# Patient Record
Sex: Female | Born: 1995 | Race: White | Hispanic: No | Marital: Single | Smoking: Never smoker
Health system: Southern US, Community
[De-identification: ages and names within clinical notes are randomized; demographics above are authoritative.]

## PROBLEM LIST (undated history)

## (undated) ENCOUNTER — Inpatient Hospital Stay (HOSPITAL_COMMUNITY): Payer: Self-pay

## (undated) DIAGNOSIS — K219 Gastro-esophageal reflux disease without esophagitis: Secondary | ICD-10-CM

## (undated) DIAGNOSIS — I959 Hypotension, unspecified: Secondary | ICD-10-CM

## (undated) DIAGNOSIS — A049 Bacterial intestinal infection, unspecified: Secondary | ICD-10-CM

## (undated) DIAGNOSIS — L03114 Cellulitis of left upper limb: Secondary | ICD-10-CM

## (undated) HISTORY — PX: OTHER SURGICAL HISTORY: SHX169

---

## 2014-05-18 ENCOUNTER — Ambulatory Visit (INDEPENDENT_AMBULATORY_CARE_PROVIDER_SITE_OTHER): Payer: BC Managed Care – PPO | Admitting: Family Medicine

## 2014-05-18 ENCOUNTER — Ambulatory Visit (INDEPENDENT_AMBULATORY_CARE_PROVIDER_SITE_OTHER): Payer: BC Managed Care – PPO

## 2014-05-18 VITALS — BP 108/72 | HR 90 | Temp 98.0°F | Resp 17 | Ht 67.0 in | Wt 128.0 lb

## 2014-05-18 DIAGNOSIS — M25522 Pain in left elbow: Secondary | ICD-10-CM

## 2014-05-18 DIAGNOSIS — M25529 Pain in unspecified elbow: Secondary | ICD-10-CM

## 2014-05-18 DIAGNOSIS — IMO0002 Reserved for concepts with insufficient information to code with codable children: Secondary | ICD-10-CM

## 2014-05-18 DIAGNOSIS — M25422 Effusion, left elbow: Secondary | ICD-10-CM

## 2014-05-18 DIAGNOSIS — M25429 Effusion, unspecified elbow: Secondary | ICD-10-CM

## 2014-05-18 LAB — POCT CBC
Granulocyte percent: 73.2 %G (ref 37–80)
HCT, POC: 43.5 % (ref 37.7–47.9)
Hemoglobin: 14.4 g/dL (ref 12.2–16.2)
Lymph, poc: 3.1 (ref 0.6–3.4)
MCH, POC: 28.6 pg (ref 27–31.2)
MCHC: 33.1 g/dL (ref 31.8–35.4)
MCV: 86.4 fL (ref 80–97)
MID (cbc): 0.3 (ref 0–0.9)
MPV: 9.3 fL (ref 0–99.8)
POC Granulocyte: 9.1 — AB (ref 2–6.9)
POC LYMPH PERCENT: 24.7 %L (ref 10–50)
POC MID %: 2.1 %M (ref 0–12)
Platelet Count, POC: 181 10*3/uL (ref 142–424)
RBC: 5.04 M/uL (ref 4.04–5.48)
RDW, POC: 12.7 %
WBC: 12.4 10*3/uL — AB (ref 4.6–10.2)

## 2014-05-18 LAB — POCT SEDIMENTATION RATE: POCT SED RATE: 10 mm/hr (ref 0–22)

## 2014-05-18 MED ORDER — DOXYCYCLINE HYCLATE 100 MG PO CAPS: 100.0000 mg | ORAL_CAPSULE | Freq: Two times a day (BID) | ORAL | Status: DC

## 2014-05-18 MED ORDER — HYDROCODONE-ACETAMINOPHEN 5-325 MG PO TABS: 1.0000 | ORAL_TABLET | Freq: Four times a day (QID) | ORAL | Status: DC | PRN

## 2014-05-18 NOTE — Progress Notes (Signed)
Subjective:    Patient ID: Megan Larsen, female    DOB: 10-07-1995, 18 y.o.   MRN: [id number] Chief Complaint  Patient presents with  . Insect Bite    arm 3 days ago     HPI  . History reviewed. No pertinent past medical history. No current outpatient prescriptions on file prior to visit.   No current facility-administered medications on file prior to visit.   No Known Allergies   Review of Systems  Constitutional: Positive for activity change. Negative for fever, chills and diaphoresis.  Musculoskeletal: Positive for arthralgias, joint swelling and myalgias. Negative for gait problem.  Skin: Positive for color change and rash. Negative for pallor and wound.  Hematological: Negative for adenopathy. Does not bruise/bleed easily.  Psychiatric/Behavioral: Positive for sleep disturbance. The patient is nervous/anxious.        Objective:  BP 108/72  Pulse 90  Temp(Src) 98 F (36.7 C) (Oral)  Resp 17  Ht 5\' 7"  (1.702 m)  Wt 128 lb (58.06 kg)  BMI 20.04 kg/m2  SpO2 98%  LMP 05/11/2014  Physical Exam  Constitutional: She is oriented to person, place, and time. She appears well-developed and well-nourished. No distress.  HENT:  Head: Normocephalic and atraumatic.  Right Ear: External ear normal.  Eyes: Conjunctivae are normal. No scleral icterus.  Pulmonary/Chest: Effort normal.  Musculoskeletal:       Left elbow: She exhibits decreased range of motion and swelling. She exhibits no effusion. Tenderness found. Lateral epicondyle tenderness noted. No olecranon process tenderness noted.  1 cm of induration, very tender, mildly warm, erythema  Neurological: She is alert and oriented to person, place, and time.  Skin: Skin is warm and dry. She is not diaphoretic. No erythema.  Psychiatric: She has a normal mood and affect. Her behavior is normal.     Results for orders placed in visit on 05/18/14  POCT CBC      Result Value Ref Range   WBC 12.4 (*) 4.6 - 10.2 K/uL   Lymph, poc 3.1  0.6 - 3.4   POC LYMPH PERCENT 24.7  10 - 50 %L   MID (cbc) 0.3  0 - 0.9   POC MID % 2.1  0 - 12 %M   POC Granulocyte 9.1 (*) 2 - 6.9   Granulocyte percent 73.2  37 - 80 %G   RBC 5.04  4.04 - 5.48 M/uL   Hemoglobin 14.4  12.2 - 16.2 g/dL   HCT, POC 40.9  81.1 - 47.9 %   MCV 86.4  80 - 97 fL   MCH, POC 28.6  27 - 31.2 pg   MCHC 33.1  31.8 - 35.4 g/dL   RDW, POC 91.4     Platelet Count, POC 181  142 - 424 K/uL   MPV 9.3  0 - 99.8 fL   Lab Results  Component Value Date   ESRSEDRATE 1 05/19/2014   POCTSEDRATE 10 05/18/2014    [facility] reading (PRIMARY) by  Dr. Clelia Croft. Left elbow: Soft tissue swelling, no bony abnormality. Assessment & Plan:   Pain and swelling of left elbow - Plan: DG Elbow Complete Left  Cellulitis and abscess of upper arm and forearm - Plan: POCT CBC, POCT SEDIMENTATION RATE - to early to I&D today - recheck w/ Weber at [facility].  Meds ordered this encounter  Medications  . doxycycline (VIBRAMYCIN) 100 MG capsule    Sig: Take 1 capsule (100 mg total) by mouth 2 (two) times  daily.    Dispense:  20 capsule    Refill:  0  . HYDROcodone-acetaminophen (NORCO/VICODIN) 5-325 MG per tablet    Sig: Take 1 tablet by mouth every 6 (six) hours as needed for moderate pain.    Dispense:  10 tablet    Refill:  0    Norberto SorensonEva Candee Hoon, MD MPH

## 2014-05-18 NOTE — Patient Instructions (Signed)
Abscess °An abscess is an infected area that contains a collection of pus and debris. It can occur in almost any part of the body. An abscess is also known as a furuncle or boil. °CAUSES  °An abscess occurs when tissue gets infected. This can occur from blockage of oil or sweat glands, infection of hair follicles, or a minor injury to the skin. As the body tries to fight the infection, pus collects in the area and creates pressure under the skin. This pressure causes pain. People with weakened immune systems have difficulty fighting infections and get certain abscesses more often.  °SYMPTOMS °Usually an abscess develops on the skin and becomes a painful mass that is red, warm, and tender. If the abscess forms under the skin, you may feel a moveable soft area under the skin. Some abscesses break open (rupture) on their own, but most will continue to get worse without care. The infection can spread deeper into the body and eventually into the bloodstream, causing you to feel ill.  °DIAGNOSIS  °Your caregiver will take your medical history and perform a physical exam. A sample of fluid may also be taken from the abscess to determine what is causing your infection. °TREATMENT  °Your caregiver may prescribe antibiotic medicines to fight the infection. However, taking antibiotics alone usually does not cure an abscess. Your caregiver may need to make a small cut (incision) in the abscess to drain the pus. In some cases, gauze is packed into the abscess to reduce pain and to continue draining the area. °HOME CARE INSTRUCTIONS  °· Only take over-the-counter or prescription medicines for pain, discomfort, or fever as directed by your caregiver. °· If you were prescribed antibiotics, take them as directed. Finish them even if you start to feel better. °· If gauze is used, follow your caregiver's directions for changing the gauze. °· To avoid spreading the infection: °¨ Keep your draining abscess covered with a  bandage. °¨ Wash your hands well. °¨ Do not share personal care items, towels, or whirlpools with others. °¨ Avoid skin contact with others. °· Keep your skin and clothes clean around the abscess. °· Keep all follow-up appointments as directed by your caregiver. °SEEK MEDICAL CARE IF:  °· You have increased pain, swelling, redness, fluid drainage, or bleeding. °· You have muscle aches, chills, or a general ill feeling. °· You have a fever. °MAKE SURE YOU:  °· Understand these instructions. °· Will watch your condition. °· Will get help right away if you are not doing well or get worse. °Document Released: 06/27/2005 Document Revised: 03/18/2012 Document Reviewed: 11/30/2011 °ExitCare® Patient Information ©2015 [employer]. This information is not intended to replace advice given to you by your health care provider. Make sure you discuss any questions you have with your health care provider. ° °Spider Bite °Spider bites are not common. Most spider bites do not cause serious problems. The elderly, very young children, and people with certain existing medical conditions are more likely to experience significant symptoms. °SYMPTOMS  °Spider bites may not cause any pain at first. Within 1 or 2 days of the bite, there may be swelling, redness, and pain in the bite area. However, some spider bites can cause pain within the first hour. °TREATMENT  °Your caregiver may prescribe antibiotic medicine if a bacterial infection develops in the bite. However, not all spider bites require antibiotics or prescription medicines.  °HOME CARE INSTRUCTIONS °· Do not scratch the bite area. °· Keep the bite area clean and   dry. Wash the area with soap and water as directed. °· Put ice or cool compresses on the bite area. °¨ Put ice in a plastic bag. °¨ Place a towel between your skin and the bag. °¨ Leave the ice on for 20 minutes, 4 times a day for the first 2 to 3 days, or as directed. °· Keep the bite area elevated above the level of  your heart. This helps reduce redness and swelling. °· Only take over-the-counter or prescription medicines as directed by your caregiver. °· If you are given antibiotics, take them as directed. Finish them even if you start to feel better. °You may need a tetanus shot if: °· You cannot remember when you had your last tetanus shot. °· You have never had a tetanus shot. °· The injury broke your skin. °If you get a tetanus shot, your arm may swell, get red, and feel warm to the touch. This is common and not a problem. If you need a tetanus shot and you choose not to have one, there is a rare chance of getting tetanus. Sickness from tetanus can be serious. °SEEK MEDICAL CARE IF: °Your bite is not better after 3 days of treatment. °SEEK IMMEDIATE MEDICAL CARE IF: °· Your bite turns purple or develops increased swelling, pain, or redness. °· You develop shortness of breath or chest pain. °· You have muscle cramps or painful muscle spasms. °· You develop abdominal pain, nausea, or vomiting. °· You feel unusually tired or sleepy. °MAKE SURE YOU: °· Understand these instructions. °· Will watch your condition. °· Will get help right away if you are not doing well or get worse. °Document Released: 10/25/2004 Document Revised: 12/10/2011 Document Reviewed: 04/18/2011 °ExitCare® Patient Information ©2015 [employer]. This information is not intended to replace advice given to you by your health care provider. Make sure you discuss any questions you have with your health care provider. ° °

## 2014-05-19 ENCOUNTER — Inpatient Hospital Stay (HOSPITAL_COMMUNITY)
Admission: EM | Admit: 2014-05-19 | Discharge: 2014-05-23 | DRG: 603 | Disposition: A | Discharge status: Home / Self Care | Payer: BC Managed Care – PPO | Attending: Internal Medicine | Admitting: Internal Medicine

## 2014-05-19 ENCOUNTER — Encounter (HOSPITAL_COMMUNITY): Payer: Self-pay | Admitting: Emergency Medicine

## 2014-05-19 DIAGNOSIS — L03114 Cellulitis of left upper limb: Secondary | ICD-10-CM

## 2014-05-19 DIAGNOSIS — IMO0002 Reserved for concepts with insufficient information to code with codable children: Principal | ICD-10-CM | POA: Diagnosis present

## 2014-05-19 DIAGNOSIS — E876 Hypokalemia: Secondary | ICD-10-CM | POA: Diagnosis present

## 2014-05-19 DIAGNOSIS — L0390 Cellulitis, unspecified: Secondary | ICD-10-CM | POA: Diagnosis present

## 2014-05-19 LAB — BASIC METABOLIC PANEL
Anion gap: 15 (ref 5–15)
BUN: 19 mg/dL (ref 6–23)
CO2: 23 mEq/L (ref 19–32)
Calcium: 9.3 mg/dL (ref 8.4–10.5)
Chloride: 100 mEq/L (ref 96–112)
Creatinine, Ser: 0.95 mg/dL (ref 0.50–1.10)
GFR calc Af Amer: >90 mL/min (ref >90)
GFR calc non Af Amer: 87 mL/min — ABNORMAL LOW (ref >90)
Glucose, Bld: 79 mg/dL (ref 70–99)
Potassium: 3.4 mEq/L — ABNORMAL LOW (ref 3.7–5.3)
Sodium: 138 mEq/L (ref 137–147)

## 2014-05-19 LAB — CBC WITH DIFFERENTIAL/PLATELET
Basophils Absolute: 0 10*3/uL (ref 0.0–0.1)
Basophils Relative: 0 % (ref 0–1)
Eosinophils Absolute: 0.1 10*3/uL (ref 0.0–0.7)
Eosinophils Relative: 1 % (ref 0–5)
HCT: 40.2 % (ref 36.0–46.0)
Hemoglobin: 14 g/dL (ref 12.0–15.0)
Lymphocytes Relative: 27 % (ref 12–46)
Lymphs Abs: 3.7 10*3/uL (ref 0.7–4.0)
MCH: 29.4 pg (ref 26.0–34.0)
MCHC: 34.8 g/dL (ref 30.0–36.0)
MCV: 84.5 fL (ref 78.0–100.0)
Monocytes Absolute: 1.1 10*3/uL — ABNORMAL HIGH (ref 0.1–1.0)
Monocytes Relative: 8 % (ref 3–12)
Neutro Abs: 8.7 10*3/uL — ABNORMAL HIGH (ref 1.7–7.7)
Neutrophils Relative %: 64 % (ref 43–77)
Platelets: 177 10*3/uL (ref 150–400)
RBC: 4.76 MIL/uL (ref 3.87–5.11)
RDW: 12.3 % (ref 11.5–15.5)
WBC: 13.7 10*3/uL — ABNORMAL HIGH (ref 4.0–10.5)

## 2014-05-19 LAB — HEPATIC FUNCTION PANEL
ALT: 13 U/L (ref 0–35)
AST: 23 U/L (ref 0–37)
Albumin: 4.4 g/dL (ref 3.5–5.2)
Alkaline Phosphatase: 80 U/L (ref 39–117)
Bilirubin, Direct: <0.2 mg/dL (ref 0.0–0.3)
Total Bilirubin: 0.5 mg/dL (ref 0.3–1.2)
Total Protein: 7.4 g/dL (ref 6.0–8.3)

## 2014-05-19 LAB — MAGNESIUM: Magnesium: 1.8 mg/dL (ref 1.5–2.5)

## 2014-05-19 LAB — PHOSPHORUS: Phosphorus: 3.7 mg/dL (ref 2.3–4.6)

## 2014-05-19 LAB — SEDIMENTATION RATE: Sed Rate: 1 mm/hr (ref 0–22)

## 2014-05-19 MED ORDER — SODIUM CHLORIDE 0.9 % IV SOLN
INTRAVENOUS | Status: AC
  Administered 2014-05-19: 06:00:00 via INTRAVENOUS

## 2014-05-19 MED ORDER — HYDROCODONE-ACETAMINOPHEN 5-325 MG PO TABS: 1.0000 | ORAL_TABLET | Freq: Four times a day (QID) | ORAL | Status: DC | PRN

## 2014-05-19 MED ORDER — MORPHINE SULFATE 4 MG/ML IJ SOLN
6.0000 mg | Freq: Once | INTRAMUSCULAR | Status: AC
  Administered 2014-05-19: 6 mg via INTRAVENOUS
  Filled 2014-05-19: qty 2

## 2014-05-19 MED ORDER — ONDANSETRON HCL 4 MG/2ML IJ SOLN
4.0000 mg | Freq: Once | INTRAMUSCULAR | Status: AC
  Administered 2014-05-19: 4 mg via INTRAVENOUS
  Filled 2014-05-19: qty 2

## 2014-05-19 MED ORDER — ACETAMINOPHEN 650 MG RE SUPP: 650.0000 mg | Freq: Four times a day (QID) | RECTAL | Status: DC | PRN

## 2014-05-19 MED ORDER — HYDROCODONE-ACETAMINOPHEN 5-325 MG PO TABS
1.0000 | ORAL_TABLET | ORAL | Status: DC | PRN
  Administered 2014-05-19: 2 via ORAL
  Administered 2014-05-19: 1 via ORAL
  Administered 2014-05-20: 2 via ORAL
  Administered 2014-05-20: 1 via ORAL
  Administered 2014-05-20 – 2014-05-22 (×5): 2 via ORAL
  Administered 2014-05-22: 1 via ORAL
  Administered 2014-05-22: 2 via ORAL
  Filled 2014-05-19 (×5): qty 2
  Filled 2014-05-19: qty 1
  Filled 2014-05-19 (×5): qty 2
  Filled 2014-05-19 (×2): qty 1

## 2014-05-19 MED ORDER — SODIUM CHLORIDE 0.9 % IV BOLUS (SEPSIS)
1000.0000 mL | Freq: Once | INTRAVENOUS | Status: AC
  Administered 2014-05-19: 1000 mL via INTRAVENOUS

## 2014-05-19 MED ORDER — SODIUM CHLORIDE 0.9 % IV SOLN: INTRAVENOUS | Status: DC

## 2014-05-19 MED ORDER — ONDANSETRON HCL 4 MG PO TABS: 4.0000 mg | ORAL_TABLET | Freq: Four times a day (QID) | ORAL | Status: DC | PRN

## 2014-05-19 MED ORDER — DOCUSATE SODIUM 100 MG PO CAPS
100.0000 mg | ORAL_CAPSULE | Freq: Two times a day (BID) | ORAL | Status: DC
  Administered 2014-05-19 – 2014-05-23 (×9): 100 mg via ORAL
  Filled 2014-05-19 (×10): qty 1

## 2014-05-19 MED ORDER — ONDANSETRON HCL 4 MG/2ML IJ SOLN
4.0000 mg | Freq: Four times a day (QID) | INTRAMUSCULAR | Status: DC | PRN
  Administered 2014-05-21: 4 mg via INTRAVENOUS
  Filled 2014-05-19: qty 2

## 2014-05-19 MED ORDER — POTASSIUM CHLORIDE CRYS ER 20 MEQ PO TBCR
40.0000 meq | EXTENDED_RELEASE_TABLET | Freq: Two times a day (BID) | ORAL | Status: AC
  Administered 2014-05-19 (×2): 40 meq via ORAL
  Filled 2014-05-19 (×2): qty 2

## 2014-05-19 MED ORDER — VANCOMYCIN HCL IN DEXTROSE 1-5 GM/200ML-% IV SOLN
1000.0000 mg | Freq: Two times a day (BID) | INTRAVENOUS | Status: DC
  Administered 2014-05-19 – 2014-05-21 (×6): 1000 mg via INTRAVENOUS
  Filled 2014-05-19 (×6): qty 200

## 2014-05-19 MED ORDER — ACETAMINOPHEN 325 MG PO TABS: 650.0000 mg | ORAL_TABLET | Freq: Four times a day (QID) | ORAL | Status: DC | PRN

## 2014-05-19 MED ORDER — CLINDAMYCIN PHOSPHATE 900 MG/50ML IV SOLN
900.0000 mg | Freq: Once | INTRAVENOUS | Status: AC
  Administered 2014-05-19: 900 mg via INTRAVENOUS
  Filled 2014-05-19: qty 50

## 2014-05-19 MED ORDER — MORPHINE SULFATE 4 MG/ML IJ SOLN
4.0000 mg | Freq: Once | INTRAMUSCULAR | Status: AC
  Administered 2014-05-19: 4 mg via INTRAVENOUS
  Filled 2014-05-19: qty 1

## 2014-05-19 MED ORDER — SODIUM CHLORIDE 0.9 % IJ SOLN
3.0000 mL | Freq: Two times a day (BID) | INTRAMUSCULAR | Status: DC
  Administered 2014-05-20 – 2014-05-22 (×3): 3 mL via INTRAVENOUS

## 2014-05-19 MED ORDER — MORPHINE SULFATE 4 MG/ML IJ SOLN
4.0000 mg | INTRAMUSCULAR | Status: DC | PRN
  Administered 2014-05-19 – 2014-05-23 (×13): 4 mg via INTRAVENOUS
  Filled 2014-05-19 (×14): qty 1

## 2014-05-19 MED ORDER — POTASSIUM CHLORIDE 10 MEQ/100ML IV SOLN
10.0000 meq | INTRAVENOUS | Status: AC
  Administered 2014-05-19 (×2): 10 meq via INTRAVENOUS
  Filled 2014-05-19 (×2): qty 100

## 2014-05-19 NOTE — ED Notes (Signed)
Patient left arm elevated and ice pack applied per MD verbal order

## 2014-05-19 NOTE — ED Provider Notes (Signed)
Medical screening examination/treatment/procedure(s) were conducted as a shared visit with non-physician practitioner(s) and myself.  I personally evaluated the patient during the encounter.   EKG Interpretation None      Patient with rapidly progressing cellulitis of left upper extremity. Abscess identified ultrasound. Patient constitutionally well appearing. IV antibiotics given and will admit to medicine team.  Loren Raceravid Geraline Halberstadt, MD 05/19/14 2308

## 2014-05-19 NOTE — ED Provider Notes (Signed)
CSN: [id number]     Arrival date & time 05/19/14  0010 History   First MD Initiated Contact with Patient 05/19/14 0023     Chief Complaint  Patient presents with  . Cellulitis     (Consider location/radiation/quality/duration/timing/severity/associated sxs/prior Treatment) HPI Patient presents to the emergency department with redness and swelling of the skin around the left elbow and forearm.  Patient, states, that she noticed a small swollen area that got larger today.  She was seen at urgent care and given antibiotics.  Patient denies fever, weakness, dizziness, headache, blurred vision, nausea, vomiting, diarrhea, chest pain, shortness of breath, rash or syncope History reviewed. No pertinent past medical history. History reviewed. No pertinent past surgical history. Family History  Problem Relation Age of Onset  . Cancer Mother   . Diabetes Mother   . Hyperlipidemia Father    History  Substance Use Topics  . Smoking status: Never Smoker   . Smokeless tobacco: Not on file  . Alcohol Use: No   OB History   Grav Para Term Preterm Abortions TAB SAB Ect Mult Living                 Review of Systems  All other systems negative except as documented in the HPI. All pertinent positives and negatives as reviewed in the HPI.  Allergies  Review of patient's allergies indicates no known allergies.  Home Medications   Prior to Admission medications   Medication Sig Start Date End Date Taking? Authorizing Provider  doxycycline (VIBRAMYCIN) 100 MG capsule Take 1 capsule (100 mg total) by mouth 2 (two) times daily. 05/18/14  Yes Sherren MochaEva N Shaw, MD  etonogestrel-ethinyl estradiol (NUVARING) 0.12-0.015 MG/24HR vaginal ring Place 1 each vaginally every 28 (twenty-eight) days. Insert vaginally and leave in place for 3 consecutive weeks, then remove for 1 week.   Yes Historical Provider, MD  HYDROcodone-acetaminophen (NORCO/VICODIN) 5-325 MG per tablet Take 1 tablet by mouth every 6 (six) hours  as needed for moderate pain. 05/18/14  Yes Sherren MochaEva N Shaw, MD  ibuprofen (ADVIL,MOTRIN) 200 MG tablet Take 800 mg by mouth every 6 (six) hours as needed for moderate pain.   Yes Historical Provider, MD   BP 115/70  Pulse 80  Temp(Src) 98.2 F (36.8 C) (Oral)  Resp 18  Ht 5\' 8"  (1.727 m)  Wt 130 lb (58.968 kg)  BMI 19.77 kg/m2  SpO2 99%  LMP 05/11/2014 Physical Exam  Constitutional: She is oriented to person, place, and time. She appears well-developed and well-nourished.  HENT:  Head: Normocephalic and atraumatic.  Mouth/Throat: Oropharynx is clear and moist.  Eyes: Pupils are equal, round, and reactive to light.  Neck: Normal range of motion. Neck supple.  Cardiovascular: Normal rate, regular rhythm and normal heart sounds.   Pulmonary/Chest: Effort normal and breath sounds normal. No respiratory distress.  Neurological: She is alert and oriented to person, place, and time. She exhibits normal muscle tone. Coordination normal.  Skin: Skin is warm and dry.       ED Course  Procedures (including critical care time) Labs Review Labs Reviewed  CBC WITH DIFFERENTIAL - Abnormal; Notable for the following:    WBC 13.7 (*)    Neutro Abs 8.7 (*)    Monocytes Absolute 1.1 (*)    All other components within normal limits  BASIC METABOLIC PANEL - Abnormal; Notable for the following:    Potassium 3.4 (*)    GFR calc non Af Amer 87 (*)    All other  components within normal limits    Imaging Review Dg Elbow Complete Left  05/18/2014   CLINICAL DATA:  Elbow pain and swelling  EXAM: LEFT ELBOW - COMPLETE 3+ VIEW  COMPARISON:  None.  FINDINGS: Four views of left elbow submitted. No acute fracture or subluxation. No posterior fat pad sign. No radiopaque foreign body.  IMPRESSION: Negative.   Electronically Signed   By: Natasha Mead M.D.   On: 05/18/2014 16:18    INCISION AND DRAINAGE Performed by: Carlyle Dolly Consent: Verbal consent obtained. Risks and benefits: risks, benefits and  alternatives were discussed Type: abscess  Body area: Left lateral forearm  Anesthesia: local infiltration  Incision was made with a scalpel.  Local anesthetic: lidocaine 2 % with epinephrine  Anesthetic total: 6 ml  Complexity: complex Blunt dissection to break up loculations  Drainage: purulent  Drainage amount: Moderate   Packing material: 1/4 in iodoform gauze  Patient tolerance: Patient tolerated the procedure well with no immediate complications.   Patient be admitted to the hospital for further evaluation and care.  She is given IV clindamycin  Carlyle Dolly, PA-C 05/19/14 [phone]

## 2014-05-19 NOTE — Progress Notes (Addendum)
[facility] PROGRESS NOTE Assessment/Plan: Left upper ext Cellulitis - started on IV vanc on 8.19.2015, afebrile, WBC unchanged. - she relates her finger are numb. Pain with wrist movement , No pain to passive movement. - redness is marked has not recede. - consult hand surgery   Hypokalemia - replete orally.     Code Status: full Family Communication: Father  Disposition Plan: inpatient   Consultants:  none  Procedures:  none  Antibiotics:  Vancomycin 8.19.2015  HPI/Subjective: Pain to movement and thrubbing.  Objective: Filed Vitals:   05/19/14 0018 05/19/14 0303 05/19/14 0426 05/19/14 0508  BP: 120/75 115/70 106/71 109/76  Pulse: 86 80 75 81  Temp: 98 F (36.7 C) 98.2 F (36.8 C)  98.3 F (36.8 C)  TempSrc: Oral Oral  Oral  Resp: 18 18 16 16   Height: 5\' 8"  (1.727 m)   5\' 8"  (1.727 m)  Weight: 58.968 kg (130 lb)   59.968 kg (132 lb 3.3 oz)  SpO2: 99% 99% 100% 98%    Intake/Output Summary (Last 24 hours) at 05/19/14 1037 Last data filed at 05/19/14 1035  Gross per 24 hour  Intake      0 ml  Output   1500 ml  Net  -1500 ml   Filed Weights   05/19/14 0018 05/19/14 0508  Weight: 58.968 kg (130 lb) 59.968 kg (132 lb 3.3 oz)    Exam:  General: Alert, awake, oriented x3, in no acute distress.  HEENT: No bruits, no goiter.  Skin: redness and swelling. MSK: no pain with passive movement.    Data Reviewed: Basic Metabolic Panel:  Recent Labs Lab 05/19/14 0127  NA 138  K 3.4*  CL 100  CO2 23  GLUCOSE 79  BUN 19  CREATININE 0.95  CALCIUM 9.3  MG 1.8  PHOS 3.7   Liver Function Tests:  Recent Labs Lab 05/19/14 0200  AST 23  ALT 13  ALKPHOS 80  BILITOT 0.5  PROT 7.4  ALBUMIN 4.4   No results found for this basename: LIPASE, AMYLASE,  in the last 168 hours No results found for this basename: AMMONIA,  in the last 168 hours CBC:  Recent Labs Lab 05/18/14 1522 05/19/14 0127  WBC 12.4* 13.7*  NEUTROABS  --  8.7*    HGB 14.4 14.0  HCT 43.5 40.2  MCV 86.4 84.5  PLT  --  177   Cardiac Enzymes: No results found for this basename: CKTOTAL, CKMB, CKMBINDEX, TROPONINI,  in the last 168 hours BNP (last 3 results) No results found for this basename: PROBNP,  in the last 8760 hours CBG: No results found for this basename: GLUCAP,  in the last 168 hours  No results found for this or any previous visit (from the past 240 hour(s)).   Studies: Dg Elbow Complete Left  05/18/2014   CLINICAL DATA:  Elbow pain and swelling  EXAM: LEFT ELBOW - COMPLETE 3+ VIEW  COMPARISON:  None.  FINDINGS: Four views of left elbow submitted. No acute fracture or subluxation. No posterior fat pad sign. No radiopaque foreign body.  IMPRESSION: Negative.   Electronically Signed   By: Natasha MeadLiviu  Pop M.D.   On: 05/18/2014 16:18    Scheduled Meds: . docusate sodium  100 mg Oral BID  . sodium chloride  3 mL Intravenous Q12H  . vancomycin  1,000 mg Intravenous Q12H   Continuous Infusions: . sodium chloride 125 mL/hr at 05/19/14 0531     FELIZ Rosine BeatTIZ, ABRAHAM  [facility] Pager  [phone]. If 8PM-8AM, please contact night-coverage at [email or web address], password [facility] 05/19/2014, 10:37 AM  LOS: 0 days      **Disclaimer: This note may have been dictated with voice recognition software. Similar sounding words can inadvertently be transcribed and this note may contain transcription errors which may not have been corrected upon publication of note.**

## 2014-05-19 NOTE — Progress Notes (Signed)
ANTIBIOTIC CONSULT NOTE - INITIAL  Pharmacy Consult for Vancomycin Indication: Cellulitis No Known Allergies  Patient Measurements: Height: 5\' 8"  (172.7 cm) Weight: 132 lb 3.3 oz (59.968 kg) IBW/kg (Calculated) : 63.9 Adjusted Body Weight:   Vital Signs: Temp: 98.2 F (36.8 C) (08/19 0303) Temp src: Oral (08/19 0303) BP: 106/71 mmHg (08/19 0426) Pulse Rate: 75 (08/19 0426) Intake/Output from previous day:   Intake/Output from this shift:    Labs:  Recent Labs  05/18/14 1522 05/19/14 0127  WBC 12.4* 13.7*  HGB 14.4 14.0  PLT  --  177  CREATININE  --  0.95   Estimated Creatinine Clearance: 91 ml/min (by C-G formula based on Cr of 0.95). No results found for this basename: VANCOTROUGH, VANCOPEAK, VANCORANDOM, GENTTROUGH, GENTPEAK, GENTRANDOM, TOBRATROUGH, TOBRAPEAK, TOBRARND, AMIKACINPEAK, AMIKACINTROU, AMIKACIN,  in the last 72 hours   Microbiology: No results found for this or any previous visit (from the past 720 hour(s)).  Medical History: History reviewed. No pertinent past medical history.  Medications:  Anti-infectives   Start     Dose/Rate Route Frequency Ordered Stop   05/19/14 0600  vancomycin (VANCOCIN) IVPB 1000 mg/200 mL premix     1,000 mg 200 mL/hr over 60 Minutes Intravenous Every 12 hours 05/19/14 0514     05/19/14 0045  clindamycin (CLEOCIN) IVPB 900 mg     900 mg 100 mL/hr over 30 Minutes Intravenous  Once 05/19/14 0040 05/19/14 0207     Assessment: Patient with Cellulitis on left elbow  Goal of Therapy:  Vancomycin trough level 10-15 mcg/ml  Plan:  Measure antibiotic drug levels at steady state Follow up culture results Vancomycin 1gm iv q12hr  Darlina GuysGrimsley Jr, Jacquenette ShoneJulian Crowford 05/19/2014,5:19 AM

## 2014-05-19 NOTE — H&P (Signed)
PCP:  No PCP Per Patient    Chief Complaint:  Left arm swelling  HPI: Megan Larsen is a 18 y.o. female   has no past medical history on file.   Presented with  On 8/18 she noticed swelling and pain around her left elbow. She has some chills. t is painful to straighten out the elbow. Denies any fever. Patient noted nodule that has been getting hard for the past 1 week on 8/18 she was playing basketball and got hit in that area since then her arm started to swell rapidly. She went to urgent care and was started on doxycycline but continued to have swelling she presented to ER. In ER she received IV clindamycin.    Hospitalist was called for admission for Left elbow cellulitis  Review of Systems:    Pertinent positives include: chills  Constitutional:  No weight loss, night sweats, Fevers,  fatigue, weight loss  HEENT:  No headaches, Difficulty swallowing,Tooth/dental problems,Sore throat,  No sneezing, itching, ear ache, nasal congestion, post nasal drip,  Cardio-vascular:  No chest pain, Orthopnea, PND, anasarca, dizziness, palpitations.no Bilateral lower extremity swelling  GI:  No heartburn, indigestion, abdominal pain, nausea, vomiting, diarrhea, change in bowel habits, loss of appetite, melena, blood in stool, hematemesis Resp:  no shortness of breath at rest. No dyspnea on exertion, No excess mucus, no productive cough, No non-productive cough, No coughing up of blood.No change in color of mucus.No wheezing. Skin:  no rash or lesions. No jaundice GU:  no dysuria, change in color of urine, no urgency or frequency. No straining to urinate.  No flank pain.  Musculoskeletal:  No joint pain or no joint swelling. No decreased range of motion. No back pain.  Psych:  No change in mood or affect. No depression or anxiety. No memory loss.  Neuro: no localizing neurological complaints, no tingling, no weakness, no double vision, no gait abnormality, no slurred speech, no  confusion  Otherwise ROS are negative except for above, 10 systems were reviewed  Past Medical History: History reviewed. No pertinent past medical history. History reviewed. No pertinent past surgical history.   Medications: Prior to Admission medications   Medication Sig Start Date End Date Taking? Authorizing Provider  doxycycline (VIBRAMYCIN) 100 MG capsule Take 1 capsule (100 mg total) by mouth 2 (two) times daily. 05/18/14  Yes Sherren Mocha, MD  etonogestrel-ethinyl estradiol (NUVARING) 0.12-0.015 MG/24HR vaginal ring Place 1 each vaginally every 28 (twenty-eight) days. Insert vaginally and leave in place for 3 consecutive weeks, then remove for 1 week.   Yes Historical Provider, MD  HYDROcodone-acetaminophen (NORCO/VICODIN) 5-325 MG per tablet Take 1 tablet by mouth every 6 (six) hours as needed for moderate pain. 05/18/14  Yes Sherren Mocha, MD  ibuprofen (ADVIL,MOTRIN) 200 MG tablet Take 800 mg by mouth every 6 (six) hours as needed for moderate pain.   Yes Historical Provider, MD    Allergies:  No Known Allergies  Social History:  Ambulatory independently  Lives at home alone    reports that she has never smoked. She does not have any smokeless tobacco history on file. She reports that she does not drink alcohol or use illicit drugs.    Family History: family history includes Cancer in her mother; Diabetes in her mother; Hyperlipidemia in her father.    Physical Exam: Patient Vitals for the past 24 hrs:  BP Temp Temp src Pulse Resp SpO2 Height Weight  05/19/14 0303 115/70 mmHg 98.2 F (36.8 C) Oral  80 18 99 % - -  05/19/14 0018 120/75 mmHg 98 F (36.7 C) Oral 86 18 99 % 5\' 8"  (1.727 m) 58.968 kg (130 lb)    1. General:  in No Acute distress 2. Psychological: Alert and Oriented 3. Head/ENT:   Moist  Mucous Membranes                          Head Non traumatic, neck supple                          Normal Dentition 4. SKIN: normal Skin turgor,  Skin clean Dry erythema  and induration noted around left elbow 5. Heart: Regular rate and rhythm no Murmur, Rub or gallop 6. Lungs: Clear to auscultation bilaterally, no wheezes or crackles   7. Abdomen: Soft, non-tender, Non distended 8. Lower extremities: no clubbing, cyanosis, or edema 9. Neurologically Grossly intact, moving all 4 extremities equally 10. MSK: Normal range of motion except in left elbow is  limited by pain  body mass index is 19.77 kg/(m^2).   Labs on Admission:   Recent Labs  05/19/14 0127  NA 138  K 3.4*  CL 100  CO2 23  GLUCOSE 79  BUN 19  CREATININE 0.95  CALCIUM 9.3   No results found for this basename: AST, ALT, ALKPHOS, BILITOT, PROT, ALBUMIN,  in the last 72 hours No results found for this basename: LIPASE, AMYLASE,  in the last 72 hours  Recent Labs  05/18/14 1522 05/19/14 0127  WBC 12.4* 13.7*  NEUTROABS  --  8.7*  HGB 14.4 14.0  HCT 43.5 40.2  MCV 86.4 84.5  PLT  --  177   No results found for this basename: CKTOTAL, CKMB, CKMBINDEX, TROPONINI,  in the last 72 hours No results found for this basename: TSH, T4TOTAL, FREET3, T3FREE, THYROIDAB,  in the last 72 hours No results found for this basename: VITAMINB12, FOLATE, FERRITIN, TIBC, IRON, RETICCTPCT,  in the last 72 hours No results found for this basename: HGBA1C    Estimated Creatinine Clearance: 89.4 ml/min (by C-G formula based on Cr of 0.95). ABG No results found for this basename: phart, pco2, po2, hco3, tco2, acidbasedef, o2sat     No results found for this basename: DDIMER     Other results:  BNP (last 3 results) No results found for this basename: PROBNP,  in the last 8760 hours  Filed Weights   05/19/14 0018  Weight: 58.968 kg (130 lb)   Cultures: No results found for this basename: sdes, specrequest, cult, reptstatus     Radiological Exams on Admission: Dg Elbow Complete Left  05/18/2014   CLINICAL DATA:  Elbow pain and swelling  EXAM: LEFT ELBOW - COMPLETE 3+ VIEW  COMPARISON:   None.  FINDINGS: Four views of left elbow submitted. No acute fracture or subluxation. No posterior fat pad sign. No radiopaque foreign body.  IMPRESSION: Negative.   Electronically Signed   By: Natasha MeadLiviu  Pop M.D.   On: 05/18/2014 16:18    Chart has been reviewed  Assessment/Plan 18 yo F with cellulitis involving left elbow rapidly progressing.   Present on Admission:  . Cellulitis - IV vancomycin, check sed rate if no improvement would consult orthopedics in AM keep left arm elevated, no evidence of abscess at this point by exam.  . Hypokalemia - will replace   Prophylaxis: SCD, Protonix  CODE STATUS:  FULL CODE  Other  plan as per orders.  I have spent a total of 55 min on this admission  Gittel Mccamish 05/19/2014, 3:45 AM  [facility]  Pager [phone]   If 7AM-7PM, please contact the day team taking care of the patient  [email or web address]  Password [email or web address]

## 2014-05-19 NOTE — ED Notes (Signed)
Pt states she thinks something bit her on her left arm on Saturday  Pt states since then she has had redness and swelling to her left arm that has progressively gotten worse  Pt states she went to urgent care today and they gave her antibiotics, pain medication, and did blood work, and xrays  Pt states since she left urgent care the redness and swelling has gotten worse

## 2014-05-20 LAB — CBC
HCT: 39 % (ref 36.0–46.0)
Hemoglobin: 13.5 g/dL (ref 12.0–15.0)
MCH: 29.1 pg (ref 26.0–34.0)
MCHC: 34.6 g/dL (ref 30.0–36.0)
MCV: 84.1 fL (ref 78.0–100.0)
Platelets: 148 10*3/uL — ABNORMAL LOW (ref 150–400)
RBC: 4.64 MIL/uL (ref 3.87–5.11)
RDW: 12.3 % (ref 11.5–15.5)
WBC: 10.3 10*3/uL (ref 4.0–10.5)

## 2014-05-20 MED ORDER — DEXTROSE 5 % IV SOLN
1.0000 g | INTRAVENOUS | Status: DC
  Administered 2014-05-20: 1 g via INTRAVENOUS
  Filled 2014-05-20: qty 10

## 2014-05-20 NOTE — Consult Note (Signed)
ORTHOPAEDIC CONSULTATION HISTORY & PHYSICAL REQUESTING PHYSICIAN: Marinda ElkAbraham Feliz Ortiz, MD  Chief Complaint: LUE infection--asked to eval for compartment syndrome  HPI: Megan Larsen is a 18 y.o. female who was admitted to medicine yesterday following ED I&D of forearm subcutaneous abscess, placed on IV antibiotics, and today reports pain with some N/T.  History reviewed. No pertinent past medical history. History reviewed. No pertinent past surgical history. History   Social History  . Marital Status: Single    Spouse Name: N/A    Number of Children: N/A  . Years of Education: N/A   Social History Main Topics  . Smoking status: Never Smoker   . Smokeless tobacco: None  . Alcohol Use: No  . Drug Use: No  . Sexual Activity: Yes    Birth Control/ Protection: Abstinence, None   Other Topics Concern  . None   Social History Narrative  . None   Family History  Problem Relation Age of Onset  . Cancer Mother   . Diabetes Mother   . Hyperlipidemia Father    No Known Allergies Prior to Admission medications   Medication Sig Start Date End Date Taking? Authorizing Provider  doxycycline (VIBRAMYCIN) 100 MG capsule Take 1 capsule (100 mg total) by mouth 2 (two) times daily. 05/18/14  Yes Sherren MochaEva N Shaw, MD  etonogestrel-ethinyl estradiol (NUVARING) 0.12-0.015 MG/24HR vaginal ring Place 1 each vaginally every 28 (twenty-eight) days. Insert vaginally and leave in place for 3 consecutive weeks, then remove for 1 week.   Yes Historical Provider, MD  HYDROcodone-acetaminophen (NORCO/VICODIN) 5-325 MG per tablet Take 1 tablet by mouth every 6 (six) hours as needed for moderate pain. 05/18/14  Yes Sherren MochaEva N Shaw, MD  ibuprofen (ADVIL,MOTRIN) 200 MG tablet Take 800 mg by mouth every 6 (six) hours as needed for moderate pain.   Yes Historical Provider, MD   Dg Elbow Complete Left  05/18/2014   CLINICAL DATA:  Elbow pain and swelling  EXAM: LEFT ELBOW - COMPLETE 3+ VIEW  COMPARISON:  None.   FINDINGS: Four views of left elbow submitted. No acute fracture or subluxation. No posterior fat pad sign. No radiopaque foreign body.  IMPRESSION: Negative.   Electronically Signed   By: Natasha MeadLiviu  Pop M.D.   On: 05/18/2014 16:18    Positive ROS: All other systems have been reviewed and were otherwise negative with the exception of those mentioned in the HPI and as above.  Physical Exam: Vitals: Refer to EMR. Constitutional:  WD, WN, NAD HEENT:  NCAT, EOMI Neuro/Psych:  Alert & oriented to person, place, and time; appropriate mood & affect Lymphatic: No generalized extremity edema or lymphadenopathy Extremities / MSK:  The extremities are normal with respect to appearance, ranges of motion, joint stability, muscle strength/tone, sensation, & perfusion except as otherwise noted:  L UE with dressing over prox ant-lat forearm.  +SQ edema, compartments not tense.  Can actively fully flex/ext all the digits fully.  Digits warm, CR brisk, pink color.  There are pen marks on the arm which would suggest that the erythema is receeding.  Assessment: L UE edema--no compartment syndrome Abscess appears to have been already drained, and cellulitis appears to be improving.  Plan: Recommend continued elevation of hand higher than elbow.  I generally pull packings that I place about 48 hours later.  Please re-consult if any further surgical concerns develop.  Cliffton Astersavid A. Janee Mornhompson, MD      Orthopaedic & Hand Surgery [facility] [address] [place], [place]  [address] Office: [phone] Mobile: [phone]

## 2014-05-20 NOTE — Care Management Note (Signed)
    Page 1 of 1   05/20/2014     11:47:27 AM CARE MANAGEMENT NOTE 05/20/2014  Patient:  Megan Larsen,Megan Larsen   Account Number:  [id number]  Date Initiated:  05/20/2014  Documentation initiated by:  Lorenda IshiharaPEELE,Destinae Neubecker  Subjective/Objective Assessment:   18 yo female admitted with cellulitis. [facility] [place]     Action/Plan:   Home when stable   Anticipated DC Date:  05/22/2014   Anticipated DC Plan:  HOME/SELF CARE      DC Planning Services  CM consult      Choice offered to / List presented to:             Status of service:  Completed, signed off Medicare Important Message given?   (If response is "NO", the following Medicare IM given date fields will be blank) Date Medicare IM given:   Medicare IM given by:   Date Additional Medicare IM given:   Additional Medicare IM given by:    Discharge Disposition:  HOME/SELF CARE  Per UR Regulation:  Reviewed for med. necessity/level of care/duration of stay  If discussed at Long Length of Stay Meetings, dates discussed:    Comments:

## 2014-05-20 NOTE — Progress Notes (Signed)
[facility] PROGRESS NOTE Assessment/Plan: Left upper ext Cellulitis - started on IV vanc on 8.19.2015, afebrile. - she relates her finger cont to be numb. Pain with wrist movement. - redness is marked has not recede. - consulted hand surgery   Hypokalemia - replete orally.   Code Status: full Family Communication: Father  Disposition Plan: inpatient   Consultants:  none  Procedures:  none  Antibiotics:  Vancomycin 8.19.2015  HPI/Subjective: Pain to movement.  Objective: Filed Vitals:   05/19/14 1400 05/19/14 1800 05/19/14 2123 05/20/14 0630  BP: 107/55 99/54 109/72 102/63  Pulse: 71 65 75 76  Temp: 97.6 F (36.4 C) 97.4 F (36.3 C) 98.1 F (36.7 C) 99 F (37.2 C)  TempSrc: Oral Oral Oral Oral  Resp: 18 18 16 16   Height:      Weight:      SpO2: 97% 99% 99% 99%    Intake/Output Summary (Last 24 hours) at 05/20/14 0814 Last data filed at 05/20/14 0630  Gross per 24 hour  Intake 2661.33 ml  Output   5200 ml  Net -2538.67 ml   Filed Weights   05/19/14 0018 05/19/14 0508  Weight: 58.968 kg (130 lb) 59.968 kg (132 lb 3.3 oz)    Exam:  General: Alert, awake, oriented x3, in no acute distress.  HEENT: No bruits, no goiter.  Skin: redness and swelling. MSK: no pain with passive movement.    Data Reviewed: Basic Metabolic Panel:  Recent Labs Lab 05/19/14 0127  NA 138  K 3.4*  CL 100  CO2 23  GLUCOSE 79  BUN 19  CREATININE 0.95  CALCIUM 9.3  MG 1.8  PHOS 3.7   Liver Function Tests:  Recent Labs Lab 05/19/14 0200  AST 23  ALT 13  ALKPHOS 80  BILITOT 0.5  PROT 7.4  ALBUMIN 4.4   No results found for this basename: LIPASE, AMYLASE,  in the last 168 hours No results found for this basename: AMMONIA,  in the last 168 hours CBC:  Recent Labs Lab 05/18/14 1522 05/19/14 0127  WBC 12.4* 13.7*  NEUTROABS  --  8.7*  HGB 14.4 14.0  HCT 43.5 40.2  MCV 86.4 84.5  PLT  --  177   Cardiac Enzymes: No results found for  this basename: CKTOTAL, CKMB, CKMBINDEX, TROPONINI,  in the last 168 hours BNP (last 3 results) No results found for this basename: PROBNP,  in the last 8760 hours CBG: No results found for this basename: GLUCAP,  in the last 168 hours  No results found for this or any previous visit (from the past 240 hour(s)).   Studies: Dg Elbow Complete Left  05/18/2014   CLINICAL DATA:  Elbow pain and swelling  EXAM: LEFT ELBOW - COMPLETE 3+ VIEW  COMPARISON:  None.  FINDINGS: Four views of left elbow submitted. No acute fracture or subluxation. No posterior fat pad sign. No radiopaque foreign body.  IMPRESSION: Negative.   Electronically Signed   By: Natasha MeadLiviu  Pop M.D.   On: 05/18/2014 16:18    Scheduled Meds: . docusate sodium  100 mg Oral BID  . sodium chloride  3 mL Intravenous Q12H  . vancomycin  1,000 mg Intravenous Q12H   Continuous Infusions: . sodium chloride 10 mL/hr at 05/19/14 2230     Marinda ElkFELIZ ORTIZ, Briannie Gutierrez  [facility] Pager [phone]. If 8PM-8AM, please contact night-coverage at [email or web address], password [email or web address] 05/20/2014, 8:14 AM  LOS: 1 day      **Disclaimer: This note may have been  dictated with voice recognition software. Similar sounding words can inadvertently be transcribed and this note may contain transcription errors which may not have been corrected upon publication of note.**

## 2014-05-21 ENCOUNTER — Inpatient Hospital Stay (HOSPITAL_COMMUNITY): Payer: BC Managed Care – PPO

## 2014-05-21 LAB — BASIC METABOLIC PANEL
Anion gap: 11 (ref 5–15)
BUN: 13 mg/dL (ref 6–23)
CO2: 25 mEq/L (ref 19–32)
Calcium: 9.5 mg/dL (ref 8.4–10.5)
Chloride: 104 mEq/L (ref 96–112)
Creatinine, Ser: 0.84 mg/dL (ref 0.50–1.10)
GFR calc Af Amer: >90 mL/min (ref >90)
GFR calc non Af Amer: >90 mL/min (ref >90)
Glucose, Bld: 111 mg/dL — ABNORMAL HIGH (ref 70–99)
Potassium: 4.3 mEq/L (ref 3.7–5.3)
Sodium: 140 mEq/L (ref 137–147)

## 2014-05-21 LAB — VANCOMYCIN, TROUGH: Vancomycin Tr: 7.1 ug/mL — ABNORMAL LOW (ref 10.0–20.0)

## 2014-05-21 MED ORDER — DEXTROSE 5 % IV SOLN
1.0000 g | INTRAVENOUS | Status: DC
  Administered 2014-05-21 – 2014-05-23 (×3): 1 g via INTRAVENOUS
  Filled 2014-05-21 (×3): qty 10

## 2014-05-21 MED ORDER — VANCOMYCIN HCL 10 G IV SOLR
1250.0000 mg | Freq: Two times a day (BID) | INTRAVENOUS | Status: DC
  Administered 2014-05-22 – 2014-05-23 (×3): 1250 mg via INTRAVENOUS
  Filled 2014-05-21 (×4): qty 1250

## 2014-05-21 MED ORDER — GADOBENATE DIMEGLUMINE 529 MG/ML IV SOLN
15.0000 mL | Freq: Once | INTRAVENOUS | Status: AC | PRN
  Administered 2014-05-21: 13 mL via INTRAVENOUS

## 2014-05-21 NOTE — Progress Notes (Signed)
ANTIBIOTIC CONSULT NOTE - FOLLOW UP  Pharmacy Consult for Vancomycin Indication: Cellulitis  No Known Allergies  Patient Measurements: Height: 5\' 8"  (172.7 cm) Weight: 132 lb 3.3 oz (59.968 kg) IBW/kg (Calculated) : 63.9  Vital Signs: Temp: 98.3 F (36.8 C) (08/21 0520) Temp src: Oral (08/21 0520) BP: 89/49 mmHg (08/21 0520) Pulse Rate: 52 (08/21 0520) Intake/Output from previous day: 08/20 0701 - 08/21 0700 In: 893.7 [P.O.:600; I.V.:243.7; IV Piggyback:50] Out: 1650 [Urine:1650] Intake/Output from this shift:    Labs:  Recent Labs  05/19/14 0127 05/20/14 0825 05/21/14 0455  WBC 13.7* 10.3  --   HGB 14.0 13.5  --   PLT 177 148*  --   CREATININE 0.95  --  0.84   Estimated Creatinine Clearance: 102.9 ml/min (by C-G formula based on Cr of 0.84). No results found for this basename: VANCOTROUGH, VANCOPEAK, VANCORANDOM, GENTTROUGH, GENTPEAK, GENTRANDOM, TOBRATROUGH, TOBRAPEAK, TOBRARND, AMIKACINPEAK, AMIKACINTROU, AMIKACIN,  in the last 72 hours   Microbiology: No results found for this or any previous visit (from the past 720 hour(s)).   Assessment: [age] yo F presented with swelling and pain around left elbow after getting hit in that area while playing basketball a week ago. She had been placed on doxycycline at urgent care but is to be admitted and started on vancomycin per pharmacy for further treatment of cellulitis on left elbow  05/19/2014 >> Clindamycin 900mg  x 1 >> 8/19 05/19/2014 >> Vancomycin >> 05/20/2014 >> Rocephin >>  Tmax: Afeb WBCs: improved to WNL Renal: SCr WNL and stable, CrCl 100 ml/min CG  No cultures  Dose changes/drug level info:  8/21 VT at 17:30 = ____ on 1g q12h   Goal of Therapy:  Vancomycin trough level 10-15 mcg/ml  Plan:   Continue Vancomycin 1g IV q12h, check trough today at 17:30 prior to 6th dose  Continue Rocephin per MD Follow up renal function & cultures, de-escalation of therapy  Loralee PacasErin Regena Delucchi, PharmD, BCPS Pager:  [phone] 05/21/2014,8:41 AM

## 2014-05-21 NOTE — Progress Notes (Signed)
BRIEF PHARMACY NOTE  18 yo F presented with swelling and pain around left elbow after getting hit in that area while playing basketball a week ago. She had been placed on doxycycline at urgent care but is to be admitted and started on vancomycin per pharmacy for further treatment of cellulitis on left elbow  05/19/2014 >> Clindamycin 900mg  x 1 >> 8/19 05/19/2014 >> Vancomycin >> 05/20/2014 >> Rocephin >>  Tmax: Afeb WBCs: improved to WNL Renal: SCr WNL and stable, CrCl 100 ml/min CG  Drug level info:  8/21 VT at 17:30 = 7.1 on 1g q12h (SUBtherapeutic)  Goal of Therapy:  Vancomycin trough level 10-15 mcg/ml   Plan:  Increase Vancomycin to 1250mg  IV Q12H Follow up renal function & cultures, de-escalation of therapy  Loma BostonLaura Ayad Nieman, PharmD Pager: [phone] 05/21/2014 7:08 PM

## 2014-05-21 NOTE — Progress Notes (Signed)
   [facility] PROGRESS NOTE Assessment/Plan: Left upper ext Cellulitis - started on IV vanc on 8.19.2015, afebrile. - MRI left elbow. - redness is marked has recede.    Hypokalemia - replete orally.   Code Status: full Family Communication: Father  Disposition Plan: inpatient   Consultants:  none  Procedures:  none  Antibiotics:  Vancomycin 8.19.2015  HPI/Subjective: Pain to movement.  Objective: Filed Vitals:   05/20/14 1300 05/20/14 1327 05/20/14 2151 05/21/14 0520  BP: 136/72 98/65 94/60  89/49  Pulse: 83 65 60 52  Temp:  98 F (36.7 C) 97.5 F (36.4 C) 98.3 F (36.8 C)  TempSrc:  Oral Axillary Oral  Resp: 16 16 17 18   Height:      Weight:      SpO2: 100% 99% 99% 99%    Intake/Output Summary (Last 24 hours) at 05/21/14 [id number] Last data filed at 05/21/14 0600  Gross per 24 hour  Intake 893.67 ml  Output   1650 ml  Net -756.33 ml   Filed Weights   05/19/14 0018 05/19/14 0508  Weight: 58.968 kg (130 lb) 59.968 kg (132 lb 3.3 oz)    Exam:  General: Alert, awake, oriented x3, in no acute distress.  HEENT: No bruits, no goiter.  Skin: redness and swelling. MSK: no pain with passive movement.    Data Reviewed: Basic Metabolic Panel:  Recent Labs Lab 05/19/14 0127 05/21/14 0455  NA 138 140  K 3.4* 4.3  CL 100 104  CO2 23 25  GLUCOSE 79 111*  BUN 19 13  CREATININE 0.95 0.84  CALCIUM 9.3 9.5  MG 1.8  --   PHOS 3.7  --    Liver Function Tests:  Recent Labs Lab 05/19/14 0200  AST 23  ALT 13  ALKPHOS 80  BILITOT 0.5  PROT 7.4  ALBUMIN 4.4   No results found for this basename: LIPASE, AMYLASE,  in the last 168 hours No results found for this basename: AMMONIA,  in the last 168 hours CBC:  Recent Labs Lab 05/19/14 0127 05/20/14 0825  WBC 13.7* 10.3  NEUTROABS 8.7*  --   HGB 14.0 13.5  HCT 40.2 39.0  MCV 84.5 84.1  PLT 177 148*   Cardiac Enzymes: No results found for this basename: CKTOTAL, CKMB, CKMBINDEX,  TROPONINI,  in the last 168 hours BNP (last 3 results) No results found for this basename: PROBNP,  in the last 8760 hours CBG: No results found for this basename: GLUCAP,  in the last 168 hours  No results found for this or any previous visit (from the past 240 hour(s)).   Studies: No results found.  Scheduled Meds: . cefTRIAXone (ROCEPHIN)  IV  1 g Intravenous Q24H  . docusate sodium  100 mg Oral BID  . sodium chloride  3 mL Intravenous Q12H  . vancomycin  1,000 mg Intravenous Q12H   Continuous Infusions: . sodium chloride 10 mL/hr at 05/21/14 0600     FELIZ Rosine BeatTIZ, Deveron Shamoon  [facility] Pager [phone]. If 8PM-8AM, please contact night-coverage at [email or web address], password [email or web address] 05/21/2014, 8:22 AM  LOS: 2 days      **Disclaimer: This note may have been dictated with voice recognition software. Similar sounding words can inadvertently be transcribed and this note may contain transcription errors which may not have been corrected upon publication of note.**

## 2014-05-22 NOTE — Progress Notes (Addendum)
[facility] PROGRESS NOTE Assessment/Plan: Left upper ext Cellulitis - started on IV vanc and rocephin on 8.19.2015, afebrile. - MRI left elbow. - redness is marked has recede. - cont one more day of IV antibiotic.    Hypokalemia - replete orally.   Code Status: full Family Communication: Father  Disposition Plan: inpatient   Consultants:  none  Procedures:  none  Antibiotics:  Vancomycin 8.19.2015  HPI/Subjective: Pain improved.  Objective: Filed Vitals:   05/21/14 0520 05/21/14 1359 05/21/14 2200 05/22/14 0530  BP: 89/49 112/64 111/69 101/68  Pulse: 52 56 80 83  Temp: 98.3 F (36.8 C) 97.7 F (36.5 C) 98.7 F (37.1 C) 97.5 F (36.4 C)  TempSrc: Oral Oral Oral Oral  Resp: Height:      Weight:      SpO2: 99% 100% 98% 100%    Intake/Output Summary (Last 24 hours) at 05/22/14 0946 Last data filed at 05/22/14 0532  Gross per 24 hour  Intake    860 ml  Output   1850 ml  Net   -990 ml   Filed Weights   05/19/14 0018 05/19/14 0508  Weight: 58.968 kg (130 lb) 59.968 kg (132 lb 3.3 oz)    Exam:  General: Alert, awake, oriented x3, in no acute distress.  HEENT: No bruits, no goiter.  Skin: redness and swelling improved MSK: no pain with passive movement.    Data Reviewed: Basic Metabolic Panel:  Recent Labs Lab 05/19/14 0127 05/21/14 0455  NA 138 140  K 3.4* 4.3  CL 100 104  CO2 23 25  GLUCOSE 79 111*  BUN 19 13  CREATININE 0.95 0.84  CALCIUM 9.3 9.5  MG 1.8  --   PHOS 3.7  --    Liver Function Tests:  Recent Labs Lab 05/19/14 0200  AST 23  ALT 13  ALKPHOS 80  BILITOT 0.5  PROT 7.4  ALBUMIN 4.4   No results found for this basename: LIPASE, AMYLASE,  in the last 168 hours No results found for this basename: AMMONIA,  in the last 168 hours CBC:  Recent Labs Lab 05/19/14 0127 05/20/14 0825  WBC 13.7* 10.3  NEUTROABS 8.7*  --   HGB 14.0 13.5  HCT 40.2 39.0  MCV 84.5 84.1  PLT 177 148*    Cardiac Enzymes: No results found for this basename: CKTOTAL, CKMB, CKMBINDEX, TROPONINI,  in the last 168 hours BNP (last 3 results) No results found for this basename: PROBNP,  in the last 8760 hours CBG: No results found for this basename: GLUCAP,  in the last 168 hours  No results found for this or any previous visit (from the past 240 hour(s)).   Studies: Mr Elbow Left W Contrast  05/21/2014   CLINICAL DATA:  Left arm pain and swelling. Recently popped skin blister.  EXAM: MRI OF THE LEFT ELBOW WITH CONTRAST  TECHNIQUE: Multiplanar, multisequence MR imaging of the elbow was performed following the administration of intravenous contrast.  CONTRAST:  13mL MULTIHANCE GADOBENATE DIMEGLUMINE 529 MG/ML IV SOLN  COMPARISON:  Radiographs 05/18/2014.  FINDINGS: Patient was unable to extend the elbow for the examination. Signal to noise is suboptimal. There is diffuse subcutaneous edema surrounding the elbow. There is ill-defined fluid within the subcutaneous fat dorsal to the olecranon process. There is apparent skin ulceration anterolaterally in the distal upper arm with an adjacent subcutaneous fluid collection measuring approximately 1.5 cm in diameter. This area is only included on the axial and  sagittal images. No deep fluid collections are identified. There is no suspicious muscular enhancement. There is no typical superficial thrombophlebitis.  There is no elbow joint effusion or abnormal synovial enhancement. There is no evidence of acute fracture or osteomyelitis.  The biceps, brachialis and triceps tendons appear normal. The flexor and extensor tendons appear unremarkable.  IMPRESSION: 1. There is a small fluid collection within the subcutaneous fat deep to the area of skin ulceration anterolaterally in the distal upper arm. This could reflect a small superficial abscess. 2. No evidence of deep abscess. 3. No evidence of osteomyelitis or septic joint.   Electronically Signed   By: Roxy Horseman  M.D.   On: 05/21/2014 15:36    Scheduled Meds: . cefTRIAXone (ROCEPHIN)  IV  1 g Intravenous Q24H  . docusate sodium  100 mg Oral BID  . sodium chloride  3 mL Intravenous Q12H  . vancomycin  1,250 mg Intravenous Q12H   Continuous Infusions: . sodium chloride 10 mL/hr at 05/22/14 0200     Marinda Elk  [facility] Pager [phone]. If 8PM-8AM, please contact night-coverage at [email or web address], password [facility] 05/22/2014, 9:46 AM  LOS: 3 days      **Disclaimer: This note may have been dictated with voice recognition software. Similar sounding words can inadvertently be transcribed and this note may contain transcription errors which may not have been corrected upon publication of note.**

## 2014-05-22 NOTE — Progress Notes (Signed)
Patient crying uncontrolably and complaining that her IV site/Arm was hurting extremely bad.  RAFA was pink and slightly swollen showing signs of the Vancomycin infiltrate.  IV was removed and ice applied to right arm with no difficulty of moving extremity present.  15 minutes later. The patient then began crying uncontrolably again and stating that "it was hurting so bad".  Her right hand appeared to be contracted backwards. Charge nurse brought in to examen the patient.  Patient was appearing to have a panic attack.  Heat applied to hand/arm and patient was calmed.  10 minutes later, the patient was able to move her right had without restriction.  IV team notified and requested to come and assess the IV site.

## 2014-05-23 MED ORDER — DOXYCYCLINE HYCLATE 100 MG PO CAPS: 100.0000 mg | ORAL_CAPSULE | Freq: Two times a day (BID) | ORAL | Status: DC

## 2014-05-23 NOTE — Progress Notes (Signed)
Patient received discharge instructions and verbalized understanding without further questions. Prescription given for doxycycline. Patient to be taken downstairs via wheelchair to be discharged home.

## 2014-05-23 NOTE — Discharge Instructions (Signed)
Please to PCP, or urgent care to take out packing of the wound.

## 2014-05-23 NOTE — Discharge Summary (Signed)
Physician Discharge Summary  Megan Larsen WUJ:[id number] DOB: 13-Jun-1996 DOA: 05/19/2014  PCP: No PCP Per Patient  Admit date: 05/19/2014 Discharge date: 05/23/2014  Time spent: 35 minutes  Recommendations for Outpatient Follow-up:  1. Follow up with urgent care to remove packing  Discharge Diagnoses:  Active Problems:   Cellulitis   Hypokalemia   Discharge Condition: stable  Diet recommendation: regular  Filed Weights   05/19/14 0018 05/19/14 0508  Weight: 58.968 kg (130 lb) 59.968 kg (132 lb 3.3 oz)    History of present illness:  On 8/18 she noticed swelling and pain around her left elbow. She has some chills. t is painful to straighten out the elbow. Denies any fever. Patient noted nodule that has been getting hard for the past 1 week on 8/18 she was playing basketball and got hit in that area since then her arm started to swell rapidly. She went to urgent care and was started on doxycycline but continued to have swelling she presented to ER. In ER she received IV clindamycin   Hospital Course:  Left upper ext Cellulitis  - started on IV vanc and rocephin on 8.19.2015, afebrile.  - MRI left elbow. Hand surgery consulted rec conservative management. - redness is marked has recede.  - change to doxy.  Hypokalemia  - replete orally.  Procedures:  MRI of elbow: no abscess.  Consultations:  Hand surgery Dr. Janee Morn.  Discharge Exam: Filed Vitals:   05/23/14 0627  BP: 89/56  Pulse: 64  Temp: 98.5 F (36.9 C)  Resp: 16    General: A&O x3 Cardiovascular: RRR Respiratory: good air movement CTA B/L  Discharge Instructions You were cared for by a hospitalist during your hospital stay. If you have any questions about your discharge medications or the care you received while you were in the hospital after you are discharged, you can call the unit and asked to speak with the hospitalist on call if the hospitalist that took care of you is not available. Once you  are discharged, your primary care physician will handle any further medical issues. Please note that NO REFILLS for any discharge medications will be authorized once you are discharged, as it is imperative that you return to your primary care physician (or establish a relationship with a primary care physician if you do not have one) for your aftercare needs so that they can reassess your need for medications and monitor your lab values.      Discharge Instructions   Diet - low sodium heart healthy    Complete by:  As directed      Increase activity slowly    Complete by:  As directed             Medication List         doxycycline 100 MG capsule  Commonly known as:  VIBRAMYCIN  Take 1 capsule (100 mg total) by mouth 2 (two) times daily.     etonogestrel-ethinyl estradiol 0.12-0.015 MG/24HR vaginal ring  Commonly known as:  NUVARING  Place 1 each vaginally every 28 (twenty-eight) days. Insert vaginally and leave in place for 3 consecutive weeks, then remove for 1 week.     HYDROcodone-acetaminophen 5-325 MG per tablet  Commonly known as:  NORCO/VICODIN  Take 1 tablet by mouth every 6 (six) hours as needed for moderate pain.     ibuprofen 200 MG tablet  Commonly known as:  ADVIL,MOTRIN  Take 800 mg by mouth every 6 (six) hours as needed for moderate  pain.       No Known Allergies    The results of significant diagnostics from this hospitalization (including imaging, microbiology, ancillary and laboratory) are listed below for reference.    Significant Diagnostic Studies: Dg Elbow Complete Left  Jun 06, 2014   CLINICAL DATA:  Elbow pain and swelling  EXAM: LEFT ELBOW - COMPLETE 3+ VIEW  COMPARISON:  None.  FINDINGS: Four views of left elbow submitted. No acute fracture or subluxation. No posterior fat pad sign. No radiopaque foreign body.  IMPRESSION: Negative.   Electronically Signed   By: Natasha Mead M.D.   On: June 06, 2014 16:18   Mr Elbow Left W Contrast  05/21/2014    CLINICAL DATA:  Left arm pain and swelling. Recently popped skin blister.  EXAM: MRI OF THE LEFT ELBOW WITH CONTRAST  TECHNIQUE: Multiplanar, multisequence MR imaging of the elbow was performed following the administration of intravenous contrast.  CONTRAST:  13mL MULTIHANCE GADOBENATE DIMEGLUMINE 529 MG/ML IV SOLN  COMPARISON:  Radiographs 2014/06/06.  FINDINGS: Patient was unable to extend the elbow for the examination. Signal to noise is suboptimal. There is diffuse subcutaneous edema surrounding the elbow. There is ill-defined fluid within the subcutaneous fat dorsal to the olecranon process. There is apparent skin ulceration anterolaterally in the distal upper arm with an adjacent subcutaneous fluid collection measuring approximately 1.5 cm in diameter. This area is only included on the axial and sagittal images. No deep fluid collections are identified. There is no suspicious muscular enhancement. There is no typical superficial thrombophlebitis.  There is no elbow joint effusion or abnormal synovial enhancement. There is no evidence of acute fracture or osteomyelitis.  The biceps, brachialis and triceps tendons appear normal. The flexor and extensor tendons appear unremarkable.  IMPRESSION: 1. There is a small fluid collection within the subcutaneous fat deep to the area of skin ulceration anterolaterally in the distal upper arm. This could reflect a small superficial abscess. 2. No evidence of deep abscess. 3. No evidence of osteomyelitis or septic joint.   Electronically Signed   By: Roxy Horseman M.D.   On: 05/21/2014 15:36    Microbiology: No results found for this or any previous visit (from the past 240 hour(s)).   Labs: Basic Metabolic Panel:  Recent Labs Lab 05/19/14 0127 05/21/14 0455  NA 138 140  K 3.4* 4.3  CL 100 104  CO2 23 25  GLUCOSE 79 111*  BUN 19 13  CREATININE 0.95 0.84  CALCIUM 9.3 9.5  MG 1.8  --   PHOS 3.7  --    Liver Function Tests:  Recent Labs Lab  05/19/14 0200  AST 23  ALT 13  ALKPHOS 80  BILITOT 0.5  PROT 7.4  ALBUMIN 4.4   No results found for this basename: LIPASE, AMYLASE,  in the last 168 hours No results found for this basename: AMMONIA,  in the last 168 hours CBC:  Recent Labs Lab 05/19/14 0127 05/20/14 0825  WBC 13.7* 10.3  NEUTROABS 8.7*  --   HGB 14.0 13.5  HCT 40.2 39.0  MCV 84.5 84.1  PLT 177 148*   Cardiac Enzymes: No results found for this basename: CKTOTAL, CKMB, CKMBINDEX, TROPONINI,  in the last 168 hours BNP: BNP (last 3 results) No results found for this basename: PROBNP,  in the last 8760 hours CBG: No results found for this basename: GLUCAP,  in the last 168 hours     Signed:  Marinda Elk  [facility] 05/23/2014, 11:08 AM

## 2014-05-24 ENCOUNTER — Emergency Department (HOSPITAL_COMMUNITY)
Admission: EM | Admit: 2014-05-24 | Discharge: 2014-05-24 | Disposition: A | Discharge status: Home / Self Care | Payer: BC Managed Care – PPO | Attending: Emergency Medicine | Admitting: Emergency Medicine

## 2014-05-24 ENCOUNTER — Encounter (HOSPITAL_COMMUNITY): Payer: Self-pay | Admitting: Emergency Medicine

## 2014-05-24 DIAGNOSIS — Z79899 Other long term (current) drug therapy: Secondary | ICD-10-CM | POA: Diagnosis not present

## 2014-05-24 DIAGNOSIS — R110 Nausea: Secondary | ICD-10-CM | POA: Diagnosis not present

## 2014-05-24 DIAGNOSIS — R42 Dizziness and giddiness: Secondary | ICD-10-CM | POA: Insufficient documentation

## 2014-05-24 MED ORDER — ONDANSETRON 4 MG PO TBDP: 4.0000 mg | ORAL_TABLET | Freq: Once | ORAL | Status: DC

## 2014-05-24 NOTE — Discharge Instructions (Signed)
Dizziness  Dizziness means you feel unsteady or lightheaded. You might feel like you are going to pass out (faint). HOME CARE   Drink enough fluids to keep your pee (urine) clear or pale yellow.  Take your medicines exactly as told by your doctor. If you take blood pressure medicine, always stand up slowly from the lying or sitting position. Hold on to something to steady yourself.  If you need to stand in one place for a long time, move your legs often. Tighten and relax your leg muscles.  Have someone stay with you until you feel okay.  Do not drive or use heavy machinery if you feel dizzy.  Do not drink alcohol. GET HELP RIGHT AWAY IF:   You feel dizzy or lightheaded and it gets worse.  You feel sick to your stomach (nauseous), or you throw up (vomit).  You have trouble talking or walking.  You feel weak or have trouble using your arms, hands, or legs.  You cannot think clearly or have trouble forming sentences.  You have chest pain, belly (abdominal) pain, sweating, or you are short of breath.  Your vision changes.  You are bleeding.  You have problems from your medicine that seem to be getting worse. MAKE SURE YOU:   Understand these instructions.  Will watch your condition.  Will get help right away if you are not doing well or get worse. Document Released: 09/06/2011 Document Revised: 12/10/2011 Document Reviewed: 09/06/2011 [facility] Patient Information 2015 [place], [place]. This information is not intended to replace advice given to you by your health care provider. Make sure you discuss any questions you have with your health care provider. When, you take your pain medication, and antibiotic tried to separate these by at least an hour.  Make sure to take both with food  At anytime, you change your mind about further workup.  Please return You have been given a prescription for Zofran to help control nausea

## 2014-05-24 NOTE — ED Provider Notes (Signed)
Medical screening examination/treatment/procedure(s) were performed by non-physician practitioner and as supervising physician I was immediately available for consultation/collaboration.   EKG Interpretation None        Macaulay Reicher, MD 05/24/14 0521 

## 2014-05-24 NOTE — ED Notes (Addendum)
Patient stated she feels better and wants to go home. Patient ambulated with no problem and is not nauseated.

## 2014-05-24 NOTE — ED Notes (Signed)
Pt arrived to the ED with a complaint of dizziness.  Pt was seen here for an infected abscess.  Pt states that she has been compliant with her medicine regiment.  Pt states that tonight she all of a sudden felt dizzy and nauseated.  Pt states she has not had any episodes of emesis.

## 2014-05-24 NOTE — ED Provider Notes (Signed)
CSN: [id number]     Arrival date & time 05/24/14  0031 History   First MD Initiated Contact with Patient 05/24/14 0046     Chief Complaint  Patient presents with  . Dizziness     (Consider location/radiation/quality/duration/timing/severity/associated sxs/prior Treatment) HPI Comments: Patient was just discharged from the hospital, where she is being treated for an abscess in the right elbow joint.  She was discharged him with Vicodin, and doxycycline shortly after taking these medicines on a.  Make she developed, nausea and lightheadedness/dizziness.  That has persisted  Patient is a 18 y.o. female presenting with dizziness. The history is provided by the patient.  Dizziness Quality:  Unable to specify Severity:  Moderate Onset quality:  Gradual Timing:  Constant Progression:  Unchanged Chronicity:  New Context: not with head movement, not with inactivity and not with loss of consciousness   Relieved by:  Nothing Worsened by:  Nothing tried Ineffective treatments:  None tried Associated symptoms: nausea   Associated symptoms: no headaches, no shortness of breath, no vision changes and no vomiting     History reviewed. No pertinent past medical history. History reviewed. No pertinent past surgical history. Family History  Problem Relation Age of Onset  . Cancer Mother   . Diabetes Mother   . Hyperlipidemia Father    History  Substance Use Topics  . Smoking status: Never Smoker   . Smokeless tobacco: Not on file  . Alcohol Use: No   OB History   Grav Para Term Preterm Abortions TAB SAB Ect Mult Living                 Review of Systems  Constitutional: Negative for fever and chills.  Respiratory: Negative for shortness of breath.   Gastrointestinal: Positive for nausea. Negative for vomiting and abdominal pain.  Neurological: Positive for dizziness and light-headedness. Negative for headaches.  All other systems reviewed and are negative.     Allergies  Review  of patient's allergies indicates no known allergies.  Home Medications   Prior to Admission medications   Medication Sig Start Date End Date Taking? Authorizing Provider  doxycycline (VIBRAMYCIN) 100 MG capsule Take 1 capsule (100 mg total) by mouth 2 (two) times daily. 05/23/14  Yes Marinda Elk, MD  etonogestrel-ethinyl estradiol (NUVARING) 0.12-0.015 MG/24HR vaginal ring Place 1 each vaginally every 28 (twenty-eight) days. Insert vaginally and leave in place for 3 consecutive weeks, then remove for 1 week.   Yes Historical Provider, MD  HYDROcodone-acetaminophen (NORCO/VICODIN) 5-325 MG per tablet Take 1 tablet by mouth every 6 (six) hours as needed for moderate pain. 05/18/14  Yes Sherren Mocha, MD  ibuprofen (ADVIL,MOTRIN) 200 MG tablet Take 800 mg by mouth every 6 (six) hours as needed for moderate pain.   Yes Historical Provider, MD  ondansetron (ZOFRAN-ODT) 4 MG disintegrating tablet Take 1 tablet (4 mg total) by mouth once. 05/24/14   Arman Filter, NP   BP 119/76  Pulse 68  Temp(Src) 97.9 F (36.6 C) (Oral)  Resp 20  SpO2 96%  LMP 05/11/2014 Physical Exam  Nursing note and vitals reviewed. Constitutional: She is oriented to person, place, and time. She appears well-developed and well-nourished.  HENT:  Head: Normocephalic.  Eyes: Pupils are equal, round, and reactive to light.  Neck: Normal range of motion.  Cardiovascular: Normal rate and regular rhythm.   Pulmonary/Chest: Effort normal and breath sounds normal.  Musculoskeletal: Normal range of motion.  Neurological: She is alert and oriented to person,  place, and time.  Skin: No pallor.    ED Course  Procedures (including critical care time) Labs Review Labs Reviewed - No data to display  Imaging Review No results found.   EKG Interpretation None      MDM   Final diagnoses:  Dizziness, nonspecific     Patient refused Zofran at this time, stating she would just like to go home and go to sleep.  She  understands that she can return anytime for further evaluation.  If she develops new or worsening symptoms.  I recommend that she separate the ingestion of her pain medicine, and antibiotic by at least an hour and make sure to take both with food    Arman Filter, NP 05/24/14 0152  Arman Filter, NP 05/24/14 6045

## 2014-07-17 ENCOUNTER — Encounter (HOSPITAL_COMMUNITY): Payer: Self-pay | Admitting: Emergency Medicine

## 2014-07-17 ENCOUNTER — Emergency Department (HOSPITAL_COMMUNITY)
Admission: EM | Admit: 2014-07-17 | Discharge: 2014-07-17 | Disposition: A | Discharge status: Home / Self Care | Payer: BC Managed Care – PPO | Attending: Emergency Medicine | Admitting: Emergency Medicine

## 2014-07-17 DIAGNOSIS — S01111A Laceration without foreign body of right eyelid and periocular area, initial encounter: Secondary | ICD-10-CM | POA: Diagnosis present

## 2014-07-17 DIAGNOSIS — Y9367 Activity, basketball: Secondary | ICD-10-CM | POA: Insufficient documentation

## 2014-07-17 DIAGNOSIS — Y92310 Basketball court as the place of occurrence of the external cause: Secondary | ICD-10-CM | POA: Insufficient documentation

## 2014-07-17 DIAGNOSIS — W51XXXA Accidental striking against or bumped into by another person, initial encounter: Secondary | ICD-10-CM | POA: Insufficient documentation

## 2014-07-17 DIAGNOSIS — IMO0002 Reserved for concepts with insufficient information to code with codable children: Secondary | ICD-10-CM

## 2014-07-17 MED ORDER — CEPHALEXIN 500 MG PO CAPS: 500.0000 mg | ORAL_CAPSULE | Freq: Four times a day (QID) | ORAL | Status: DC

## 2014-07-17 MED ORDER — NAPROXEN 500 MG PO TABS: 500.0000 mg | ORAL_TABLET | Freq: Two times a day (BID) | ORAL | Status: DC

## 2014-07-17 NOTE — ED Notes (Signed)
Per pt. Got hit playing basketball-right eye laceration

## 2014-07-17 NOTE — ED Provider Notes (Signed)
CSN: [id number]     Arrival date & time 07/17/14  1521 History  This chart was scribed for non-physician practitioner working with No att. providers found by Elveria Risingimelie Horne, ED Scribe. This patient was seen in room WTR9/WTR9 and the patient's care was started at 8:54 AM.   Chief Complaint  Patient presents with  . Laceration   The history is provided by the patient. No language interpreter was used.   HPI Comments: Megan Larsen is a 18 y.o. female who presents to the Emergency Department with right eye laceration after getting hit while playing basketball. At 11:00 this morning patient states she was going after the ball at the same time as another player when a player's elbow hit her in the right eye. She did not lose consciousness, no nausea vomiting. Patient reports she went to a football game after the incident and waited until the end to come get evaluated. No fevers, vision changes, rhinorrhea or other facial pain   History reviewed. No pertinent past medical history. History reviewed. No pertinent past surgical history. Family History  Problem Relation Age of Onset  . Cancer Mother   . Diabetes Mother   . Hyperlipidemia Father    History  Substance Use Topics  . Smoking status: Never Smoker   . Smokeless tobacco: Not on file  . Alcohol Use: No   OB History   Grav Para Term Preterm Abortions TAB SAB Ect Mult Living                 Review of Systems  Eyes: Positive for pain.  Skin: Positive for wound.       Laceration  All other systems reviewed and are negative.     Allergies  Review of patient's allergies indicates no known allergies.  Home Medications   Prior to Admission medications   Medication Sig Start Date End Date Taking? Authorizing Provider  cephALEXin (KEFLEX) 500 MG capsule Take 1 capsule (500 mg total) by mouth 4 (four) times daily. 07/17/14   Sharlene MottsBenjamin W Shafin Pollio, PA-C  HYDROcodone-acetaminophen (NORCO/VICODIN) 5-325 MG per tablet Take 1-2 tablets by  mouth every 4 (four) hours as needed. 07/18/14   Shari A Upstill, PA-C  naproxen (NAPROSYN) 500 MG tablet Take 1 tablet (500 mg total) by mouth 2 (two) times daily. 07/17/14   Sharlene MottsBenjamin W Berline Semrad, PA-C   Triage Vitals: BP 112/67  Pulse 89  Temp(Src) 98.1 F (36.7 C) (Oral)  Resp 18  SpO2 99%  LMP 07/17/2014  Physical Exam  Nursing note and vitals reviewed. Constitutional: She is oriented to person, place, and time. She appears well-developed and well-nourished. No distress.  HENT:  Head: Normocephalic and atraumatic.  Eyes: Conjunctivae and EOM are normal. Pupils are equal, round, and reactive to light. Right eye exhibits no discharge. Left eye exhibits no discharge. No scleral icterus.    Superficial linear laceration approximately 2 cm across the lateral aspect of the upper eyelid of the right eye. Hemostasis achieved. No foreign body visualized  Neck: Neck supple. No tracheal deviation present.  Cardiovascular: Normal rate.   Pulmonary/Chest: Effort normal. No respiratory distress.  Musculoskeletal: Normal range of motion.  Neurological: She is alert and oriented to person, place, and time.  Skin: Skin is warm and dry.  Psychiatric: She has a normal mood and affect. Her behavior is normal.    ED Course  Procedures (including critical care time)  COORDINATION OF CARE: 8:54 AM- Discussed treatment plan with patient at bedside and patient agreed to  plan.   Labs Review Labs Reviewed - No data to display  Imaging Review Ct Orbitss W/o Cm  07/18/2014   CLINICAL DATA:  RIGHT supraorbital soft tissue laceration today while playing basketball, now with increasing pain and swelling.  EXAM: CT ORBITS WITHOUT CONTRAST  TECHNIQUE: Multidetector CT imaging of the orbits was performed following the standard protocol without intravenous contrast.  COMPARISON:  None.  FINDINGS: RIGHT periorbital soft tissue swelling without subcutaneous gas, radiopaque foreign bodies or focal fluid  collection on this nonenhanced examination. Ocular globes intact and well located. Preservation of the orbital fat. Extraocular muscles are located unremarkable. Normal appearance optic nerve sheath complexes. No facial fracture in the included view. Trace bilateral maxillary sinus mucosal thickening without air-fluid levels. No destructive bony lesions.  IMPRESSION: RIGHT periorbital soft tissue swelling may be posttraumatic or may reflect cellulitis without postseptal hematoma nor orbital fracture.   Electronically Signed   By: Awilda Metroourtnay  Bloomer   On: 07/18/2014 02:18     EKG Interpretation None      MDM  Vitals stable - WNL -afebrile Pt resting comfortably in ED. She refuses any imaging of her eye or surrounding orbit.  PE--extraocular movements intact, no evidence of preseptal or postseptal cellulitis. No other eye trauma noted  Will DC with naproxen for inflammation and pain management. Strict return precautions given. Discussed f/u with PCP and return precautions, pt very amenable to plan. Pt is stable, in good condition and is appropriate for discharge   Final diagnoses:  Laceration      I personally performed the services described in this documentation, which was scribed in my presence. The recorded information has been reviewed and is accurate.    Sharlene MottsBenjamin W Vickki Igou, PA-C 07/18/14 [phone]

## 2014-07-18 ENCOUNTER — Emergency Department (HOSPITAL_COMMUNITY)
Admission: EM | Admit: 2014-07-18 | Discharge: 2014-07-18 | Disposition: A | Discharge status: Home / Self Care | Payer: BC Managed Care – PPO | Attending: Emergency Medicine | Admitting: Emergency Medicine

## 2014-07-18 ENCOUNTER — Encounter (HOSPITAL_COMMUNITY): Payer: Self-pay | Admitting: Emergency Medicine

## 2014-07-18 ENCOUNTER — Emergency Department (HOSPITAL_COMMUNITY): Payer: BC Managed Care – PPO

## 2014-07-18 DIAGNOSIS — X58XXXA Exposure to other specified factors, initial encounter: Secondary | ICD-10-CM | POA: Diagnosis not present

## 2014-07-18 DIAGNOSIS — Z792 Long term (current) use of antibiotics: Secondary | ICD-10-CM | POA: Insufficient documentation

## 2014-07-18 DIAGNOSIS — Y92310 Basketball court as the place of occurrence of the external cause: Secondary | ICD-10-CM | POA: Insufficient documentation

## 2014-07-18 DIAGNOSIS — S0011XA Contusion of right eyelid and periocular area, initial encounter: Secondary | ICD-10-CM | POA: Diagnosis not present

## 2014-07-18 DIAGNOSIS — H5711 Ocular pain, right eye: Secondary | ICD-10-CM | POA: Diagnosis present

## 2014-07-18 DIAGNOSIS — Z791 Long term (current) use of non-steroidal anti-inflammatories (NSAID): Secondary | ICD-10-CM | POA: Diagnosis not present

## 2014-07-18 DIAGNOSIS — S0511XA Contusion of eyeball and orbital tissues, right eye, initial encounter: Secondary | ICD-10-CM

## 2014-07-18 DIAGNOSIS — Z79899 Other long term (current) drug therapy: Secondary | ICD-10-CM | POA: Insufficient documentation

## 2014-07-18 DIAGNOSIS — Y9367 Activity, basketball: Secondary | ICD-10-CM | POA: Diagnosis not present

## 2014-07-18 MED ORDER — HYDROCODONE-ACETAMINOPHEN 5-325 MG PO TABS: 1.0000 | ORAL_TABLET | ORAL | Status: DC | PRN

## 2014-07-18 MED ORDER — HYDROCODONE-ACETAMINOPHEN 5-325 MG PO TABS
1.0000 | ORAL_TABLET | Freq: Once | ORAL | Status: AC
  Administered 2014-07-18: 1 via ORAL
  Filled 2014-07-18: qty 1

## 2014-07-18 NOTE — ED Provider Notes (Signed)
Medical screening examination/treatment/procedure(s) were performed by non-physician practitioner and as supervising physician I was immediately available for consultation/collaboration.   EKG Interpretation None        Hanley SeamenJohn L Puja Caffey, MD 07/18/14 [phone]

## 2014-07-18 NOTE — Discharge Instructions (Signed)
Cryotherapy °Cryotherapy means treatment with cold. Ice or gel packs can be used to reduce both pain and swelling. Ice is the most helpful within the first 24 to 48 hours after an injury or flare-up from overusing a muscle or joint. Sprains, strains, spasms, burning pain, shooting pain, and aches can all be eased with ice. Ice can also be used when recovering from surgery. Ice is effective, has very few side effects, and is safe for most people to use. °PRECAUTIONS  °Ice is not a safe treatment option for people with: °· Raynaud phenomenon. This is a condition affecting small blood vessels in the extremities. Exposure to cold may cause your problems to return. °· Cold hypersensitivity. There are many forms of cold hypersensitivity, including: °¨ Cold urticaria. Red, itchy hives appear on the skin when the tissues begin to warm after being iced. °¨ Cold erythema. This is a red, itchy rash caused by exposure to cold. °¨ Cold hemoglobinuria. Red blood cells break down when the tissues begin to warm after being iced. The hemoglobin that carry oxygen are passed into the urine because they cannot combine with blood proteins fast enough. °· Numbness or altered sensitivity in the area being iced. °If you have any of the following conditions, do not use ice until you have discussed cryotherapy with your caregiver: °· Heart conditions, such as arrhythmia, angina, or chronic heart disease. °· High blood pressure. °· Healing wounds or open skin in the area being iced. °· Current infections. °· Rheumatoid arthritis. °· Poor circulation. °· Diabetes. °Ice slows the blood flow in the region it is applied. This is beneficial when trying to stop inflamed tissues from spreading irritating chemicals to surrounding tissues. However, if you expose your skin to cold temperatures for too long or without the proper protection, you can damage your skin or nerves. Watch for signs of skin damage due to cold. °HOME CARE INSTRUCTIONS °Follow  these tips to use ice and cold packs safely. °· Place a dry or damp towel between the ice and skin. A damp towel will cool the skin more quickly, so you may need to shorten the time that the ice is used. °· For a more rapid response, add gentle compression to the ice. °· Ice for no more than 10 to 20 minutes at a time. The bonier the area you are icing, the less time it will take to get the benefits of ice. °· Check your skin after 5 minutes to make sure there are no signs of a poor response to cold or skin damage. °· Rest 20 minutes or more between uses. °· Once your skin is numb, you can end your treatment. You can test numbness by very lightly touching your skin. The touch should be so light that you do not see the skin dimple from the pressure of your fingertip. When using ice, most people will feel these normal sensations in this order: cold, burning, aching, and numbness. °· Do not use ice on someone who cannot communicate their responses to pain, such as small children or people with dementia. °HOW TO MAKE AN ICE PACK °Ice packs are the most common way to use ice therapy. Other methods include ice massage, ice baths, and cryosprays. Muscle creams that cause a cold, tingly feeling do not offer the same benefits that ice offers and should not be used as a substitute unless recommended by your caregiver. °To make an ice pack, do one of the following: °· Place crushed ice or a   bag of frozen vegetables in a sealable plastic bag. Squeeze out the excess air. Place this bag inside another plastic bag. Slide the bag into a pillowcase or place a damp towel between your skin and the bag. °· Mix 3 parts water with 1 part rubbing alcohol. Freeze the mixture in a sealable plastic bag. When you remove the mixture from the freezer, it will be slushy. Squeeze out the excess air. Place this bag inside another plastic bag. Slide the bag into a pillowcase or place a damp towel between your skin and the bag. °SEEK MEDICAL CARE  IF: °· You develop white spots on your skin. This may give the skin a blotchy (mottled) appearance. °· Your skin turns blue or pale. °· Your skin becomes waxy or hard. °· Your swelling gets worse. °MAKE SURE YOU:  °· Understand these instructions. °· Will watch your condition. °· Will get help right away if you are not doing well or get worse. °Document Released: 05/14/2011 Document Revised: 02/01/2014 Document Reviewed: 05/14/2011 °[employer]® Patient Information ©2015 [employer]. This information is not intended to replace advice given to you by your health care provider. Make sure you discuss any questions you have with your health care provider. ° °

## 2014-07-18 NOTE — ED Notes (Signed)
Patient was given crackers and water per md.

## 2014-07-18 NOTE — ED Provider Notes (Signed)
CSN: [id number]     Arrival date & time 07/18/14  0001 History   First MD Initiated Contact with Patient 07/18/14 0017     Chief Complaint  Patient presents with  . Eye Pain     (Consider location/radiation/quality/duration/timing/severity/associated sxs/prior Treatment) Patient is a 18 y.o. female presenting with eye pain. The history is provided by the patient. No language interpreter was used.  Eye Pain This is a new problem. Pertinent negatives include no congestion, coughing, fever, headaches, nausea or vomiting. Associated symptoms comments: She returns to the ED after being evaluated for eye lid laceration earlier today. At that time, she did not have any painful eye movement, visual changes, headache, N, V. She states she went home after the laceration repair and during the cours eof the evening she sneezed, after which she had onset of significant orbital swelling and pain with eye movement. She still denies headache, nausea or vomiting. No visual changes. .    History reviewed. No pertinent past medical history. History reviewed. No pertinent past surgical history. Family History  Problem Relation Age of Onset  . Cancer Mother   . Diabetes Mother   . Hyperlipidemia Father    History  Substance Use Topics  . Smoking status: Never Smoker   . Smokeless tobacco: Not on file  . Alcohol Use: No   OB History   Grav Para Term Preterm Abortions TAB SAB Ect Mult Living                 Review of Systems  Constitutional: Negative for fever.  HENT: Negative.  Negative for congestion and sinus pressure.   Eyes: Positive for pain. Negative for visual disturbance.  Respiratory: Negative.  Negative for cough and shortness of breath.   Gastrointestinal: Negative.  Negative for nausea and vomiting.  Musculoskeletal: Negative.   Skin:       No change to the wound previously repaired.  Neurological: Negative.  Negative for headaches.      Allergies  Review of patient's  allergies indicates no known allergies.  Home Medications   Prior to Admission medications   Medication Sig Start Date End Date Taking? Authorizing Provider  cephALEXin (KEFLEX) 500 MG capsule Take 1 capsule (500 mg total) by mouth 4 (four) times daily. 07/17/14   Sharlene MottsBenjamin W Cartner, PA-C  doxycycline (VIBRAMYCIN) 100 MG capsule Take 1 capsule (100 mg total) by mouth 2 (two) times daily. 05/23/14   Marinda ElkAbraham Feliz Ortiz, MD  etonogestrel-ethinyl estradiol (NUVARING) 0.12-0.015 MG/24HR vaginal ring Place 1 each vaginally every 28 (twenty-eight) days. Insert vaginally and leave in place for 3 consecutive weeks, then remove for 1 week.    Historical Provider, MD  HYDROcodone-acetaminophen (NORCO/VICODIN) 5-325 MG per tablet Take 1 tablet by mouth every 6 (six) hours as needed for moderate pain. 05/18/14   Sherren MochaEva N Shaw, MD  ibuprofen (ADVIL,MOTRIN) 200 MG tablet Take 800 mg by mouth every 6 (six) hours as needed for moderate pain.    Historical Provider, MD  naproxen (NAPROSYN) 500 MG tablet Take 1 tablet (500 mg total) by mouth 2 (two) times daily. 07/17/14   Earle GellBenjamin W Cartner, PA-C  ondansetron (ZOFRAN-ODT) 4 MG disintegrating tablet Take 1 tablet (4 mg total) by mouth once. 05/24/14   Arman FilterGail K Schulz, NP   BP 115/64  Pulse 69  Temp(Src) 98.8 F (37.1 C) (Oral)  Resp 16  SpO2 100%  LMP 07/17/2014 Physical Exam  Constitutional: She is oriented to person, place, and time. She appears well-developed and  well-nourished. No distress.  HENT:  Head: Normocephalic.  Eyes: Conjunctivae are normal.  Right eye: No conjunctival hemorrhage. No entrapment. There is periorbital swelling greatest over lateral aspects.   Cardiovascular: Normal rate.   Pulmonary/Chest: Effort normal.  Abdominal: There is no tenderness.  Musculoskeletal: Normal range of motion.  Neurological: She is alert and oriented to person, place, and time. Coordination normal.  Skin: Skin is warm and dry.  Linear laceration right upper eye  lid without dehiscence.     ED Course  Procedures (including critical care time) Labs Review Labs Reviewed - No data to display  Imaging Review No results found.   EKG Interpretation None      MDM   Final diagnoses:  None    1. Contusion right eye  No fracture or post-septal changes on CT scan. Soft tissue swelling only. Pain is improved with medications.     Arnoldo HookerShari A Daanya Lanphier, PA-C 07/18/14 [phone]

## 2014-07-18 NOTE — ED Notes (Signed)
Pt arrived to the ED with a complaint of right eye pain and swelling.  Pt got a cut above the eye today playing basketball.  Pt was seen for it this am but returned due to swelling.

## 2014-07-19 NOTE — ED Provider Notes (Signed)
Medical screening examination/treatment/procedure(s) were performed by non-physician practitioner and as supervising physician I was immediately available for consultation/collaboration.  Toy CookeyMegan Docherty, MD 07/19/14 [phone]

## 2014-07-25 ENCOUNTER — Emergency Department (HOSPITAL_COMMUNITY)
Admission: EM | Admit: 2014-07-25 | Discharge: 2014-07-25 | Disposition: A | Discharge status: Home / Self Care | Payer: BC Managed Care – PPO | Attending: Emergency Medicine | Admitting: Emergency Medicine

## 2014-07-25 ENCOUNTER — Encounter (HOSPITAL_COMMUNITY): Payer: Self-pay | Admitting: Emergency Medicine

## 2014-07-25 DIAGNOSIS — L089 Local infection of the skin and subcutaneous tissue, unspecified: Secondary | ICD-10-CM

## 2014-07-25 DIAGNOSIS — H5711 Ocular pain, right eye: Secondary | ICD-10-CM | POA: Diagnosis present

## 2014-07-25 DIAGNOSIS — Z792 Long term (current) use of antibiotics: Secondary | ICD-10-CM | POA: Diagnosis not present

## 2014-07-25 DIAGNOSIS — Z791 Long term (current) use of non-steroidal anti-inflammatories (NSAID): Secondary | ICD-10-CM | POA: Diagnosis not present

## 2014-07-25 HISTORY — DX: Hypotension, unspecified: I95.9

## 2014-07-25 MED ORDER — CLINDAMYCIN HCL 150 MG PO CAPS: 300.0000 mg | ORAL_CAPSULE | Freq: Three times a day (TID) | ORAL | Status: DC

## 2014-07-25 MED ORDER — HYDROCODONE-ACETAMINOPHEN 5-325 MG PO TABS: 2.0000 | ORAL_TABLET | ORAL | Status: DC | PRN

## 2014-07-25 NOTE — Discharge Instructions (Signed)
Take Clindamycin as directed until gone. Take Vicodin as needed for pain. Return to the ED with worsening or concerning symptoms.

## 2014-07-25 NOTE — ED Notes (Signed)
Pt has laceration above R eye from 8 days ago when elbowed in face playing basketball. Wound was glued together; later that night pt states she sneezed and wound re-opened and she had swelling around whole R eye. Pt continues to have slight swelling around laceration, but states it has gone down a lot.

## 2014-07-25 NOTE — ED Provider Notes (Signed)
CSN: [id number]     Arrival date & time 07/25/14  2029 History  This chart was scribed for non-physician practitioner, Emilia BeckKaitlyn Salimatou Simone, PA-C , working with Ward GivensIva L Knapp, MD by Milly JakobJohn Lee Graves, ED Scribe. The patient was seen in room WTR8/WTR8. Patient's care was started at 8:39 PM.     Chief Complaint  Patient presents with  . Facial Laceration   The history is provided by the patient. No language interpreter was used.   HPI Comments: Megan Larsen is a 18 y.o. female who presents to the Emergency Department complaining of redness, swelling, and pain around the site of a laceration repair over her right eye. She reports that she was seen here 8 days ago for a laceration above her right eye that she occurred when she was elbowed in the face playing basketball. She reports that the evening of her laceration repair she sneezed, re-opening the wound and causing swelling around her entire right eye. She continues to have mild swelling and pain around the site of the glue. She reports that she had a CT-scan of her head when she was seen here 8 days ago. She denies any other injury.   No past medical history on file. No past surgical history on file. Family History  Problem Relation Age of Onset  . Cancer Mother   . Diabetes Mother   . Hyperlipidemia Father    History  Substance Use Topics  . Smoking status: Never Smoker   . Smokeless tobacco: Not on file  . Alcohol Use: No   OB History   Grav Para Term Preterm Abortions TAB SAB Ect Mult Living                 Review of Systems  Constitutional: Negative for fever and chills.  Gastrointestinal: Negative for nausea and vomiting.  Skin: Positive for wound (repaired laceartion over right eyebrow).  All other systems reviewed and are negative.  Allergies  Review of patient's allergies indicates no known allergies.  Home Medications   Prior to Admission medications   Medication Sig Start Date End Date Taking? Authorizing Provider   cephALEXin (KEFLEX) 500 MG capsule Take 1 capsule (500 mg total) by mouth 4 (four) times daily. 07/17/14   Sharlene MottsBenjamin W Cartner, PA-C  HYDROcodone-acetaminophen (NORCO/VICODIN) 5-325 MG per tablet Take 1-2 tablets by mouth every 4 (four) hours as needed. 07/18/14   Shari A Upstill, PA-C  naproxen (NAPROSYN) 500 MG tablet Take 1 tablet (500 mg total) by mouth 2 (two) times daily. 07/17/14   Sharlene MottsBenjamin W Cartner, PA-C   Triage Vitals: BP 102/57  Pulse 68  Temp(Src) 98.3 F (36.8 C) (Oral)  Resp 18  SpO2 100%  LMP 07/17/2014 Physical Exam  Nursing note and vitals reviewed. Constitutional: She is oriented to person, place, and time. She appears well-developed and well-nourished. No distress.  HENT:  Head: Normocephalic and atraumatic.  Healing 1 CM laceration on right eyelid with tenderness and swelling. No fluctuance but suspision for localized infection.  Eyes: Conjunctivae and EOM are normal.  Neck: Neck supple. No tracheal deviation present.  Cardiovascular: Normal rate.   Pulmonary/Chest: Effort normal. No respiratory distress.  Musculoskeletal: Normal range of motion.  Neurological: She is alert and oriented to person, place, and time.  Skin: Skin is warm and dry.  Psychiatric: She has a normal mood and affect. Her behavior is normal.    ED Course  Procedures (including critical care time) DIAGNOSTIC STUDIES: Oxygen Saturation is 100% on room air, normal  by my interpretation.    COORDINATION OF CARE: 8:43 PM-Discussed treatment plan which includes ABX with pt at bedside and pt agreed to plan.   Labs Review Labs Reviewed - No data to display  Imaging Review No results found.   EKG Interpretation None      MDM   Final diagnoses:  Skin infection    Patient will have Clindamycin for infected laceration. Patient will have vicodin for pain. Vitals stable and patient afebrile.   I personally performed the services described in this documentation, which was scribed in my  presence. The recorded information has been reviewed and is accurate.   Emilia BeckKaitlyn Cerena Baine, PA-C 07/26/14 [id number]  Ward GivensIva L Knapp, MD 08/07/14 1250

## 2014-11-28 ENCOUNTER — Inpatient Hospital Stay (HOSPITAL_COMMUNITY): Payer: Self-pay

## 2014-11-28 ENCOUNTER — Inpatient Hospital Stay (HOSPITAL_COMMUNITY)
Admission: AD | Admit: 2014-11-28 | Discharge: 2014-11-28 | Disposition: A | Discharge status: Home / Self Care | Payer: Self-pay | Source: Ambulatory Visit | Attending: Obstetrics & Gynecology | Admitting: Obstetrics & Gynecology

## 2014-11-28 ENCOUNTER — Encounter (HOSPITAL_COMMUNITY): Payer: Self-pay | Admitting: *Deleted

## 2014-11-28 DIAGNOSIS — R109 Unspecified abdominal pain: Secondary | ICD-10-CM | POA: Insufficient documentation

## 2014-11-28 DIAGNOSIS — O208 Other hemorrhage in early pregnancy: Secondary | ICD-10-CM | POA: Insufficient documentation

## 2014-11-28 DIAGNOSIS — O99611 Diseases of the digestive system complicating pregnancy, first trimester: Secondary | ICD-10-CM

## 2014-11-28 DIAGNOSIS — K5900 Constipation, unspecified: Secondary | ICD-10-CM

## 2014-11-28 DIAGNOSIS — O26899 Other specified pregnancy related conditions, unspecified trimester: Secondary | ICD-10-CM

## 2014-11-28 DIAGNOSIS — Z3A01 Less than 8 weeks gestation of pregnancy: Secondary | ICD-10-CM

## 2014-11-28 DIAGNOSIS — O9989 Other specified diseases and conditions complicating pregnancy, childbirth and the puerperium: Secondary | ICD-10-CM | POA: Insufficient documentation

## 2014-11-28 HISTORY — DX: Gastro-esophageal reflux disease without esophagitis: K21.9

## 2014-11-28 HISTORY — DX: Cellulitis of left upper limb: L03.114

## 2014-11-28 HISTORY — DX: Bacterial intestinal infection, unspecified: A04.9

## 2014-11-28 LAB — URINALYSIS, ROUTINE W REFLEX MICROSCOPIC
Bilirubin Urine: NEGATIVE
Glucose, UA: NEGATIVE mg/dL
Hgb urine dipstick: NEGATIVE
Ketones, ur: NEGATIVE mg/dL
Leukocytes, UA: NEGATIVE
Nitrite: NEGATIVE
Protein, ur: NEGATIVE mg/dL
Specific Gravity, Urine: 1.025 (ref 1.005–1.030)
Urobilinogen, UA: 0.2 mg/dL (ref 0.0–1.0)
pH: 6 (ref 5.0–8.0)

## 2014-11-28 LAB — CBC
HCT: 39.9 % (ref 36.0–46.0)
Hemoglobin: 13.7 g/dL (ref 12.0–15.0)
MCH: 29.8 pg (ref 26.0–34.0)
MCHC: 34.3 g/dL (ref 30.0–36.0)
MCV: 86.7 fL (ref 78.0–100.0)
Platelets: 149 10*3/uL — ABNORMAL LOW (ref 150–400)
RBC: 4.6 MIL/uL (ref 3.87–5.11)
RDW: 13 % (ref 11.5–15.5)
WBC: 11.6 10*3/uL — ABNORMAL HIGH (ref 4.0–10.5)

## 2014-11-28 LAB — WET PREP, GENITAL
Clue Cells Wet Prep HPF POC: NONE SEEN
Trich, Wet Prep: NONE SEEN
Yeast Wet Prep HPF POC: NONE SEEN

## 2014-11-28 LAB — POCT PREGNANCY, URINE: Preg Test, Ur: POSITIVE — AB

## 2014-11-28 NOTE — MAU Provider Note (Signed)
History     CSN: [id number]  Arrival date and time: 11/28/14 1758   None     Chief Complaint  Patient presents with  . Vaginal Discharge  . Possible Pregnancy   HPI This is a 19 y.o. female at 5619w2d who presents with c/o mucous only bowel movements. States has not seen any stool in a week. Has had some cramping for a few days. + UPT. Denies bleeidng  RN Note: Pt presents to MAU with complaints having loose mucous bowel movements. Reports + HPT last week. Cramping in her lower abdomen. Denies any vaginal bleeding.           OB History    Gravida Para Term Preterm AB TAB SAB Ectopic Multiple Living   2               Past Medical History  Diagnosis Date  . Hypotension   . Intestinal infection due to bacteria   . GERD (gastroesophageal reflux disease)   . Cellulitis of arm, left     surgically debreeded and packed.    Past Surgical History  Procedure Laterality Date  . Left arm      cellulitis    Family History  Problem Relation Age of Onset  . Cancer Mother   . Diabetes Mother   . Hyperlipidemia Father     History  Substance Use Topics  . Smoking status: Never Smoker   . Smokeless tobacco: Not on file  . Alcohol Use: No    Allergies: No Known Allergies  Prescriptions prior to admission  Medication Sig Dispense Refill Last Dose  . cephALEXin (KEFLEX) 500 MG capsule Take 1 capsule (500 mg total) by mouth 4 (four) times daily. 40 capsule 0 More than a month at Unknown time  . clindamycin (CLEOCIN) 150 MG capsule Take 2 capsules (300 mg total) by mouth 3 (three) times daily. May dispense as 150mg  capsules 60 capsule 0 More than a month at Unknown time  . HYDROcodone-acetaminophen (NORCO/VICODIN) 5-325 MG per tablet Take 1-2 tablets by mouth every 4 (four) hours as needed. 12 tablet 0 More than a month at Unknown time  . HYDROcodone-acetaminophen (NORCO/VICODIN) 5-325 MG per tablet Take 2 tablets by mouth every 4 (four) hours as needed for moderate pain or  severe pain. 12 tablet 0 More than a month at Unknown time  . naproxen (NAPROSYN) 500 MG tablet Take 1 tablet (500 mg total) by mouth 2 (two) times daily. 30 tablet 0 Unknown at Unknown time    Review of Systems  Constitutional: Negative for fever, chills and malaise/fatigue.  Gastrointestinal: Positive for abdominal pain and constipation. Negative for nausea, vomiting and diarrhea.  Genitourinary: Negative for dysuria.  Neurological: Negative for dizziness.   Physical Exam   Blood pressure 107/63, pulse 59, temperature 98.4 F (36.9 C), resp. rate 18, height 5\' 7"  (1.702 m), weight 135 lb (61.236 kg), last menstrual period 09/24/2014.  Physical Exam  Constitutional: She is oriented to person, place, and time. She appears well-developed and well-nourished. No distress.  HENT:  Head: Normocephalic.  Cardiovascular: Normal rate.   Respiratory: Effort normal.  GI: Soft. She exhibits no distension. There is no tenderness. There is no rebound and no guarding.  Genitourinary: Vagina normal. No vaginal discharge found.  Uterus is enlarged, 8-9 wk size Nontender Cervix long and closed  Musculoskeletal: Normal range of motion.  Neurological: She is alert and oriented to person, place, and time.  Skin: Skin is warm and dry.  Psychiatric:  She has a normal mood and affect.    MAU Course  Procedures  MDM Cultures obtained CBC, HIV ordered US ordered Results for orders placed or performed during the hospital encounter of 11/28/14 (from the past 24 hour(s))  Urinalysis, Routine w reflex microscopic     Status: None   Collection Time: 11/28/14  6:15 PM  Result Value Ref Range   Color, Urine YELLOW YELLOW   APPearance CLEAR CLEAR   Specific Gravity, Urine 1.025 1.005 - 1.030   pH 6.0 5.0 - 8.0   Glucose, UA NEGATIVE NEGATIVE mg/dL   Hgb urine dipstick NEGATIVE NEGATIVE   Bilirubin Urine NEGATIVE NEGATIVE   Ketones, ur NEGATIVE NEGATIVE mg/dL   Protein, ur NEGATIVE NEGATIVE mg/dL    Urobilinogen, UA 0.2 0.0 - 1.0 mg/dL   Nitrite NEGATIVE NEGATIVE   Leukocytes, UA NEGATIVE NEGATIVE  Pregnancy, urine POC     Status: Abnormal   Collection Time: 11/28/14  6:23 PM  Result Value Ref Range   Preg Test, Ur POSITIVE (A) NEGATIVE  Wet prep, genital     Status: Abnormal   Collection Time: 11/28/14  7:00 PM  Result Value Ref Range   Yeast Wet Prep HPF POC NONE SEEN NONE SEEN   Trich, Wet Prep NONE SEEN NONE SEEN   Clue Cells Wet Prep HPF POC NONE SEEN NONE SEEN   WBC, Wet Prep HPF POC FEW (A) NONE SEEN  CBC     Status: Abnormal   Collection Time: 11/28/14  7:15 PM  Result Value Ref Range   WBC 11.6 (H) 4.0 - 10.5 K/uL   RBC 4.60 3.87 - 5.11 MIL/uL   Hemoglobin 13.7 12.0 - 15.0 g/dL   HCT 81.1 91.4 - 78.2 %   MCV 86.7 78.0 - 100.0 fL   MCH 29.8 26.0 - 34.0 pg   MCHC 34.3 30.0 - 36.0 g/dL   RDW 95.6 21.3 - 08.6 %   Platelets 149 (L) 150 - 400 K/uL     Assessment and Plan  A:  Pregnancy at [redacted]w[redacted]d by LMP      R/O ectopic  P:  Patient is in [place] now       Report to oncoming CNM  [facility] 11/28/2014, 6:46 PM   Ultrasound: Uterus: FINDINGS: Intrauterine gestational sac: Visualized/normal in shape.  Yolk sac: Visualized  Embryo: Visualized  Cardiac Activity: Visualized  Heart Rate: 124 bpm  MSD: mm w d  CRL: 9.4 mm 7 w 0 d [place] EDC: 07/17/2015  Maternal uterus/adnexae: Small subchorionic hemorrhage measuring 1.7 x 0.5 x 0.3 cm. No adnexal masses or free fluid. Left corpus luteum cyst.  IMPRESSION: Seven week intrauterine pregnancy. Fetal heart rate 124 beats per minute. Small subchorionic hemorrhage.  A:   19 y.o. G2P0 at [redacted]w[redacted]d IUP Abdominal Pain - Constipation  Plan: Discharge to home [place] for constipation Begin prenatal care  Walidah Kennith Gain, CNM

## 2014-11-28 NOTE — MAU Note (Signed)
Pt concerned about her choice with wether to keep pregnancy. Mother and boyfriend encouraging pt not to keep it.  [facility].

## 2014-11-28 NOTE — MAU Note (Signed)
Pt presents to MAU with complaints having loose mucous bowel movements. Reports + HPT last week. Cramping in her lower abdomen. Denies any vaginal bleeding.

## 2014-11-29 LAB — HIV ANTIBODY (ROUTINE TESTING W REFLEX): HIV Screen 4th Generation wRfx: NONREACTIVE

## 2014-11-29 LAB — GC/CHLAMYDIA PROBE AMP (~~LOC~~) NOT AT ARMC
Chlamydia: NEGATIVE
Neisseria Gonorrhea: NEGATIVE

## 2015-04-20 ENCOUNTER — Emergency Department (HOSPITAL_COMMUNITY)
Admission: EM | Admit: 2015-04-20 | Discharge: 2015-04-20 | Disposition: A | Discharge status: Home / Self Care | Payer: BLUE CROSS/BLUE SHIELD | Attending: Emergency Medicine | Admitting: Emergency Medicine

## 2015-04-20 ENCOUNTER — Emergency Department (HOSPITAL_COMMUNITY): Payer: BLUE CROSS/BLUE SHIELD

## 2015-04-20 ENCOUNTER — Encounter (HOSPITAL_COMMUNITY): Payer: Self-pay | Admitting: Emergency Medicine

## 2015-04-20 DIAGNOSIS — Z79899 Other long term (current) drug therapy: Secondary | ICD-10-CM | POA: Diagnosis not present

## 2015-04-20 DIAGNOSIS — R079 Chest pain, unspecified: Secondary | ICD-10-CM | POA: Diagnosis not present

## 2015-04-20 DIAGNOSIS — Z8719 Personal history of other diseases of the digestive system: Secondary | ICD-10-CM | POA: Insufficient documentation

## 2015-04-20 DIAGNOSIS — I10 Essential (primary) hypertension: Secondary | ICD-10-CM | POA: Diagnosis not present

## 2015-04-20 DIAGNOSIS — Z872 Personal history of diseases of the skin and subcutaneous tissue: Secondary | ICD-10-CM | POA: Diagnosis not present

## 2015-04-20 DIAGNOSIS — Z8619 Personal history of other infectious and parasitic diseases: Secondary | ICD-10-CM | POA: Diagnosis not present

## 2015-04-20 LAB — CBC WITH DIFFERENTIAL/PLATELET
Basophils Absolute: 0 10*3/uL (ref 0.0–0.1)
Basophils Relative: 0 % (ref 0–1)
Eosinophils Absolute: 0.2 10*3/uL (ref 0.0–0.7)
Eosinophils Relative: 2 % (ref 0–5)
HCT: 40.8 % (ref 36.0–46.0)
Hemoglobin: 13.9 g/dL (ref 12.0–15.0)
Lymphocytes Relative: 21 % (ref 12–46)
Lymphs Abs: 1.8 10*3/uL (ref 0.7–4.0)
MCH: 29 pg (ref 26.0–34.0)
MCHC: 34.1 g/dL (ref 30.0–36.0)
MCV: 85.2 fL (ref 78.0–100.0)
Monocytes Absolute: 0.7 10*3/uL (ref 0.1–1.0)
Monocytes Relative: 8 % (ref 3–12)
Neutro Abs: 5.9 10*3/uL (ref 1.7–7.7)
Neutrophils Relative %: 69 % (ref 43–77)
Platelets: 131 10*3/uL — ABNORMAL LOW (ref 150–400)
RBC: 4.79 MIL/uL (ref 3.87–5.11)
RDW: 12.2 % (ref 11.5–15.5)
WBC: 8.6 10*3/uL (ref 4.0–10.5)

## 2015-04-20 LAB — BASIC METABOLIC PANEL
Anion gap: 11 (ref 5–15)
BUN: 13 mg/dL (ref 6–20)
CO2: 24 mmol/L (ref 22–32)
Calcium: 9.3 mg/dL (ref 8.9–10.3)
Chloride: 107 mmol/L (ref 101–111)
Creatinine, Ser: 0.95 mg/dL (ref 0.44–1.00)
GFR calc Af Amer: >60 mL/min (ref >60)
GFR calc non Af Amer: >60 mL/min (ref >60)
Glucose, Bld: 85 mg/dL (ref 65–99)
Potassium: 3.2 mmol/L — ABNORMAL LOW (ref 3.5–5.1)
Sodium: 142 mmol/L (ref 135–145)

## 2015-04-20 LAB — D-DIMER, QUANTITATIVE: D-Dimer, Quant: <0.27 ug/mL-FEU (ref 0.00–0.48)

## 2015-04-20 LAB — I-STAT TROPONIN, ED
Troponin i, poc: 0 ng/mL (ref 0.00–0.08)
Troponin i, poc: 0.01 ng/mL (ref 0.00–0.08)

## 2015-04-20 LAB — RAPID STREP SCREEN (MED CTR MEBANE ONLY): Streptococcus, Group A Screen (Direct): NEGATIVE

## 2015-04-20 MED ORDER — POTASSIUM CHLORIDE CRYS ER 20 MEQ PO TBCR
20.0000 meq | EXTENDED_RELEASE_TABLET | Freq: Once | ORAL | Status: AC
  Administered 2015-04-20: 20 meq via ORAL
  Filled 2015-04-20: qty 1

## 2015-04-20 MED ORDER — SODIUM CHLORIDE 0.9 % IV BOLUS (SEPSIS)
1000.0000 mL | Freq: Once | INTRAVENOUS | Status: AC
  Administered 2015-04-20: 1000 mL via INTRAVENOUS

## 2015-04-20 MED ORDER — IBUPROFEN 800 MG PO TABS
800.0000 mg | ORAL_TABLET | Freq: Once | ORAL | Status: AC
  Administered 2015-04-20: 800 mg via ORAL
  Filled 2015-04-20: qty 1

## 2015-04-20 NOTE — ED Notes (Signed)
Pt states she started a new medication yesterday for her ADHD called Eveko  Pt states she took the medication at 5pm and then last night she started having chest pain  Pt states the pain gets worse if she talks or coughs

## 2015-04-20 NOTE — Discharge Instructions (Signed)
Chest Pain (Nonspecific) Take Tylenol or Motrin for pain. Return for shortness of breath or chest pain. Follow-up with a primary care provider using the resource guide below. It is often hard to give a diagnosis for the cause of chest pain. There is always a chance that your pain could be related to something serious, such as a heart attack or a blood clot in the lungs. You need to follow up with your doctor. HOME CARE  If antibiotic medicine was given, take it as directed by your doctor. Finish the medicine even if you start to feel better.  For the next few days, avoid activities that bring on chest pain. Continue physical activities as told by your doctor.  Do not use any tobacco products. This includes cigarettes, chewing tobacco, and e-cigarettes.  Avoid drinking alcohol.  Only take medicine as told by your doctor.  Follow your doctor's suggestions for more testing if your chest pain does not go away.  Keep all doctor visits you made. GET HELP IF:  Your chest pain does not go away, even after treatment.  You have a rash with blisters on your chest.  You have a fever. GET HELP RIGHT AWAY IF:   You have more pain or pain that spreads to your arm, neck, jaw, back, or belly (abdomen).  You have shortness of breath.  You cough more than usual or cough up blood.  You have very bad back or belly pain.  You feel sick to your stomach (nauseous) or throw up (vomit).  You have very bad weakness.  You pass out (faint).  You have chills. This is an emergency. Do not wait to see if the problems will go away. Call your local emergency services (911 in U.S.). Do not drive yourself to the hospital. MAKE SURE YOU:   Understand these instructions.  Will watch your condition.  Will get help right away if you are not doing well or get worse. Document Released: 03/05/2008 Document Revised: 09/22/2013 Document Reviewed: 03/05/2008 [facility] Patient Information 2015 [employer] [place].  This information is not intended to replace advice given to you by your health care provider. Make sure you discuss any questions you have with your health care provider.  Emergency Department Resource Guide 1) Find a Doctor and Pay Out of Pocket Although you won't have to find out who is covered by your insurance plan, it is a good idea to ask around and get recommendations. You will then need to call the office and see if the doctor you have chosen will accept you as a new patient and what types of options they offer for patients who are self-pay. Some doctors offer discounts or will set up payment plans for their patients who do not have insurance, but you will need to ask so you aren't surprised when you get to your appointment.  2) Contact Your Local Health Department Not all health departments have doctors that can see patients for sick visits, but many do, so it is worth a call to see if yours does. If you don't know where your local health department is, you can check in your phone book. The CDC also has a tool to help you locate your state's health department, and many state websites also have listings of all of their local health departments.  3) Find a Walk-in Clinic If your illness is not likely to be very severe or complicated, you may want to try a walk in clinic. These are popping up all over  the country in pharmacies, drugstores, and shopping centers. They're usually staffed by nurse practitioners or physician assistants that have been trained to treat common illnesses and complaints. They're usually fairly quick and inexpensive. However, if you have serious medical issues or chronic medical problems, these are probably not your best option.  No Primary Care Doctor: - Call Health Connect at  [phone] - they can help you locate a primary care doctor that  accepts your insurance, provides certain services, etc. - Physician Referral Service- [phone]  Chronic Pain  Problems: Organization         Address  Phone   Notes  [facility]  [phone] Patients need to be referred by their primary care doctor.   Medication Assistance: Organization         Address  Phone   Notes  [facility] Medication [facility] [address] [place], [place] [address] [phone]) [phone] --Must be a resident of [place] [facility] -- Must have NO insurance coverage whatsoever (no Medicaid/ Medicare, etc.) -- The pt. MUST have a primary care doctor that directs their care regularly and follows them in the community   MedAssist  [phone]   [employer]  [phone]    Agencies that provide inexpensive medical care: Organization         Address  Phone   Notes  [facility]  [phone]   [facility]    [phone]) [phone]   [facility] [address] [place], [place] [address] [phone]   [facility] [address], [place] [phone]) [phone]   Planned Parenthood    [phone]) [phone]   [facility]    [phone]   Community Health and [facility]  [address], [place] Phone:  [phone], Fax:  [phone] Hours of Operation:  9 am - 6 pm, M-F.  Also accepts Medicaid/Medicare and self-pay.  [facility]  [address], [place] Phone: [phone](336) [phone], Fax: [phone]) [phone]. Hours of Operation:  8:30 am - 5:30 pm, M-F.  Also accepts Medicaid and self-pay.  [facility] [place] [address], [place] Phone: [phone]) [phone]   [facility] [address], [place], [place] [phone], Ext. [phone] Mondays & Thursdays: 7-9 AM.  First 15 patients are seen on a first come, first serve basis.    Medicaid-accepting [facility] Providers:  Organization         Address  Phone   Notes  [facility] [address], [place] [phone]) [phone] Also  accepts self-pay patients.  [facility] [address] [place], [place]  [phone]   [facility] [address], [place] [phone]) [phone]   [facility] [address] [place] [address], [place] [phone]) [phone]   Renaye RakersVeita Bland [address], [place]   [phone]) [phone] Only accepts [place] Access [place] patients after they have their name applied to their card.   Self-Pay (no insurance) in [facility] [place]:  Organization         Address  Phone   Notes  Sickle Cell Patients, [facility] [address], [place] [phone]) [phone]   [facility] [address], [place] [phone]   [facility] [place]  [address], [place] [phone]   [facility]/Dr. Osei-Bonsu  [address], [place] or [address], [place] [phone] Phone number for both [place] and [place] locations is the same.  Urgent Medical and [facility] [address], [place] [phone]   [facility] [address], [place] or [address] [phone]   [facility] [address] [place] [phone], phone; [phone], fax Sees patients 1st and 3rd Saturday of every month.  Must not qualify for public or private insurance (i.e. Medicaid, Medicare, [place] Health Choice, Veterans' Benefits)  Household income should be no more than 200% of the poverty level The clinic cannot treat you if you are pregnant or think you are pregnant  Sexually transmitted diseases are not treated at the clinic.    Dental Care: Organization         Address  Phone  Notes  [facility] [address] [place], [place] [phone] Accepts children up to age 27 who are enrolled in [place] or [place] Health Choice; pregnant  women with a Medicaid card; and children who have applied for Medicaid or [place] Health Choice, but were declined, whose parents can pay a reduced fee at time of service.  [facility] Department of [facility]  [address], [place] [phone] Accepts children up to age 41 who are enrolled in [place] or [place] Health Choice; pregnant women with a Medicaid card; and children who have applied for Medicaid or [place] Health Choice, but were declined, whose parents can pay a reduced fee at time of service.  [facility]  [address] [place], [place] [phone] Patients are seen by appointment only. Walk-ins are not accepted. [facility] will see patients 39 years of age and older. Monday - Tuesday (8am-5pm) Most Wednesdays (8:30-5pm) $30 per visit, cash only  [facility] Access PROGRAM  [address], [facility] [phone] Patients are seen by appointment only. Walk-ins are not accepted. [facility] will see patients 58 years of age and older. One Wednesday Evening (Monthly: Volunteer Based).  $30 per visit, cash only  [employer]  [phone] for adults; Children under age 67, call Graduate Pediatric Dentistry at [phone]. Children aged 5-14, please call [phone] to request a pediatric application.  Dental services are provided in all areas of dental care including fillings, crowns and bridges, complete and partial dentures, implants, gum treatment, root canals, and extractions. Preventive care is also provided. Treatment is provided to both adults and children. Patients are selected via a lottery and there is often a waiting list.   [facility] [address] [place]  [phone] [email or web address]   [facility] [address] [place], [place] [phone], Ext. [phone] Second and Fourth Thursday of each month, opens at 6:30 AM; Clinic ends at 9 AM.  Patients are  seen on a first-come first-served basis, and a limited number are seen during each clinic.   [facility]  [address] [place] [place], [place] [phone]   Eligibility Requirements You must have lived in [place], [place], or [place] counties for at least the last three months.   You cannot be eligible for state or federal sponsored [place], including [employer], [place], or [employer].  You generally cannot be eligible for healthcare insurance through your employer.    How to apply: Eligibility screenings are held every Tuesday and Wednesday afternoon from 1:00 pm until 4:00 pm. You do not need an appointment for the interview!  [facility] [address] [place], [place]   [place]  [place] Department  [place]  [phone]    Behavioral Health Resources in the Community: Intensive Outpatient Programs Organization         Address  Phone  Notes  [place]. [address] [place], [place] [phone]   [facility] [address] [place], [place]   ADS: Alcohol & Drug Svcs [address], [place], [place]   [place] [address]  [place], [place] or [phone]   Substance Abuse Resources Organization         Address  Phone  Notes  Alcohol and Drug Services  [phone]   [place]  [phone]   The [facility]   Chinita Pester  [phone]   Residential & Outpatient Substance Abuse Program  [phone]   Psychological Services Organization         Address  Phone  Notes  [facility] [place]  [place]  [phone]   [place] [address] [place] or [phone]    Mobile Crisis  Teams Organization         Address  Phone  Notes  [facility], Mobile Crisis Care Unit  [phone]   [facility]otherapeutic [facility]  [address] [place], [place]   [place] Levels [address] [place] [phone]    Self-Help/Support Groups Organization         Address  Phone             Notes  [place]. of [place] - variety of support groups  [place] Call for more information  Narcotics Anonymous (NA), [facility] [address], [employer] [place]  2 meetings at this location   Special educational needs teacher         Address  Phone  Notes  ASAP Residential Treatment [place],    [place]  [phone]   [facility]  [address], [place] [phone], [place], [place]   [place], [place] [phone] Admissions: 8am-3pm M-F  Incentives Substance [place] [address],    [place], [place] [phone]   [facility] [address] [place], [place], [place]   [facility] [address]  [place], [place]   Insight Programs - Intensive Outpatient [address], [place], [place]   [facility] ([place].) [place].,  [place], [place] [phone] or [phone]   [facility] Services (RTS) [address], [place], [place] Accepts Medicaid  [facility] [place] [place] [address],  [place] [phone] Substance Abuse/Addiction Treatment   [facility] Organization         Address  Phone  Notes  [facility]  [phone]   Domenic Schwab, PhD [address] [place], [place]   [phone] or [phone]   [facility]   [phone]  [address] [place], [place] [phone]   [facility] [address] [place], [place] [phone]  Insurance/Medicaid/sponsorship through [facility] Families [address], [address] [place], [place] [phone] [place], [place] [phone]    Dr. Adele Schilder  [phone]   [facility] Dept. 1) [address], [place] 2) [place] 3)  [place] [address], [place] [phone]  [phone]   [place] [phone] or [phone] (After Hours)

## 2015-04-20 NOTE — ED Notes (Signed)
Pt c/o chest pain that began last pm; pt describes the pain as sharp and worse when she talks; pt c/o mild shortness of breath; no respiratory distress; lung sounds clear; chest is not tender to palpation

## 2015-04-20 NOTE — ED Provider Notes (Signed)
CSN: [id number]     Arrival date & time 04/20/15  0601 History   First MD Initiated Contact with Patient 04/20/15 [phone]     Chief Complaint  Patient presents with  . Chest Pain     (Consider location/radiation/quality/duration/timing/severity/associated sxs/prior Treatment) Patient is a 19 y.o. female presenting with chest pain. The history is provided by the patient. No language interpreter was used.  Chest Pain Associated symptoms: no abdominal pain, no cough, no fever, no nausea and not vomiting   Megan Larsen is a 19 year old female who presents for chest pain that began last night which she describes as a sharp stabbing pain and has been constant since. The pain woke her up from sleep a couple of hours ago. She denies any radiation of the pain. No treatment PTA. She states it is worse with talking and coughing. She denies any recent illness, fever, chills, cough, shortness of breath, chest pain, abdominal pain, nausea, vomiting, diarrhea, leg swelling. She has an IUD. She denies any recent surgeries, travel, previous DVTs or PE. No aspirin use. She denies a family history or smoking history. Past Medical History  Diagnosis Date  . Hypotension   . Intestinal infection due to bacteria   . GERD (gastroesophageal reflux disease)   . Cellulitis of arm, left     surgically debreeded and packed.   Past Surgical History  Procedure Laterality Date  . Left arm      cellulitis   Family History  Problem Relation Age of Onset  . Cancer Mother   . Diabetes Mother   . Hyperlipidemia Father    History  Substance Use Topics  . Smoking status: Never Smoker   . Smokeless tobacco: Not on file  . Alcohol Use: No   OB History    Gravida Para Term Preterm AB TAB SAB Ectopic Multiple Living   2              Review of Systems  Constitutional: Negative for fever.  Respiratory: Negative for cough.   Cardiovascular: Positive for chest pain.  Gastrointestinal: Negative for nausea, vomiting and  abdominal pain.  All other systems reviewed and are negative.     Allergies  Megan Larsen  Home Medications   Prior to Admission medications   Medication Sig Start Date End Date Taking? Authorizing Provider  Amphetamine Sulfate (Megan Larsen) 10 MG TABS Take 10 mg by mouth 2 (two) times daily.   Yes Historical Provider, MD  ibuprofen (ADVIL,MOTRIN) 800 MG tablet Take 800 mg by mouth every 8 (eight) hours as needed for mild pain or moderate pain.   Yes Historical Provider, MD   BP 101/67 mmHg  Pulse 60  Temp(Src) 97.6 F (36.4 C) (Oral)  Resp 20  SpO2 100%  LMP 09/24/2014  Breastfeeding? Unknown Physical Exam  Constitutional: She is oriented to person, place, and time. She appears well-developed and well-nourished.  HENT:  Head: Normocephalic and atraumatic.  Mouth/Throat: Uvula is midline and mucous membranes are normal. No trismus in the jaw. No uvula swelling. Posterior oropharyngeal erythema present. No oropharyngeal exudate, posterior oropharyngeal edema or tonsillar abscesses.  No drooling or tripoding. No shortness of breath.  Eyes: Conjunctivae are normal.  Neck: Normal range of motion. Neck supple.  Cardiovascular: Normal rate, regular rhythm and normal heart sounds.   Pulmonary/Chest: Effort normal and breath sounds normal. No accessory muscle usage. No respiratory distress. She has no decreased breath sounds. She has no wheezes. She has no rales. She exhibits no tenderness.  Abdominal: Soft.  There is no tenderness.  Musculoskeletal: Normal range of motion. She exhibits no edema or tenderness.  No calf tenderness or leg swelling.  Neurological: She is alert and oriented to person, place, and time.  Skin: Skin is warm and dry.  Psychiatric: She has a normal mood and affect. Her behavior is normal.  Nursing note and vitals reviewed.   ED Course  Procedures (including critical care time) Labs Review Labs Reviewed  CBC WITH DIFFERENTIAL/PLATELET - Abnormal; Notable for the  following:    Platelets 131 (*)    All other components within normal limits  BASIC METABOLIC PANEL - Abnormal; Notable for the following:    Potassium 3.2 (*)    All other components within normal limits  RAPID STREP SCREEN (NOT AT [facility])  CULTURE, GROUP A STREP  D-DIMER, QUANTITATIVE (NOT AT [facility])  I-STAT TROPOININ, ED  Megan SensorI-STAT TROPOININ, ED    Imaging Review Dg Chest 2 View  04/20/2015   CLINICAL DATA:  One day history of midportion chest pain  EXAM: CHEST  2 VIEW  COMPARISON:  None.  FINDINGS: Lungs are clear. Heart size and pulmonary vascularity are normal. No adenopathy. No bone lesions. No pneumothorax. Small metallic foreign body in the anterior upper chest midline by report represents a piercing.  IMPRESSION: No edema or consolidation.   Electronically Signed   By: Bretta BangWilliam  Woodruff III M.D.   On: 04/20/2015 07:27     EKG Interpretation   Date/Time:  Wednesday April 20 2015 06:14:24 EDT Ventricular Rate:  81 PR Interval:  144 QRS Duration: 93 QT Interval:  382 QTC Calculation: 443 R Axis:   99 Text Interpretation:  Sinus rhythm Baseline wander in lead(s) V1 Confirmed  by [facility]  MD, APRIL ([id number]) on 04/20/2015 6:54:17 AM      MDM   Final diagnoses:  Chest pain, unspecified chest pain type   Patient presents for chest pain since last night.  She is mildly hypokalemic but otherwise her labs are unremarkable. Troponin is negative and EKG is not concerning for WPW. [facility] to [facility] since she is on birth control but d-dimer is negative. Chest x-ray is negative for edema, infiltrate, pneumothorax. Recheck: Patient states she no longer has pain in her chest but feels like it's in her throat. Strep negative. I reviewed this [facility] criteria and I do not believe she needs anabiotic's. Her second troponin was also negative. Medications  ibuprofen (ADVIL,MOTRIN) tablet 800 mg (800 mg Oral Given 04/20/15 0722)  potassium chloride SA (K-DUR,KLOR-CON) CR tablet 20 mEq  (20 mEq Oral Given 04/20/15 0850)  sodium chloride 0.9 % bolus 1,000 mL (1,000 mLs Intravenous New Bag/Given 04/20/15 0857)  She can follow-up with a primary care provider using the resource guide. I discussed return precautions. She can take Tylenol or Motrin for pain. Patient verbally agrees with the plan.   Catha GosselinHanna Patel-Mills, PA-C 04/20/15 1056  Raeford RazorStephen Kohut, MD 04/21/15 1421

## 2015-04-20 NOTE — Progress Notes (Signed)
Pt states she is a [employer]ing [employer] and confirms bcbs coverage and [place] in [place]   [facility] ED CM spoke with pt on how to obtain an in network pcp with insurance coverage via the customer service number or web site  Cm reviewed ED level of care for crisis/emergent services and community pcp level of care to manage continuous or chronic medical concerns.  The pt voiced understanding CM encouraged pt and discussed pt's responsibility to verify with pt's insurance carrier that any recommended medical provider offered by any emergency room or a hospital provider is within the carrier's network. The pt voiced understanding   Also CM discussed and provided written information discussed the importance of pcp vs EDP services for f/u care, www.needymeds.org, www.goodrx.com, discounted pharmacies and other [place] resources such as [facility] affordable care act, financial assistance,  [place] med assist, DSS and  health department  Reviewed resources for [place]  like [facility], family medicine at [address], [facility], [facility], local urgent care centers, [facility], [facility], general medical clinics, [facility] services of the [place], [facility] urgent care plus others, medication resources, [facility] out patient pharmacies and housing Pt voiced understanding and appreciation of resources provided

## 2015-04-22 LAB — CULTURE, GROUP A STREP: Strep A Culture: NEGATIVE

## 2015-05-11 IMAGING — US US OB TRANSVAGINAL
1 series · 14 of 28 positions shown · non-contrast
Comparison: None.

CLINICAL DATA: Loose bloody bowel movements. Abdominal pain.
Positive urine pregnancy test.

EXAM:
OBSTETRIC <14 WK US AND TRANSVAGINAL OB US
TECHNIQUE: Both transabdominal and transvaginal ultrasound examinations were
performed for complete evaluation of the gestation as well as the
maternal uterus, adnexal regions, and pelvic cul-de-sac.
Transvaginal technique was performed to assess early pregnancy.

[Series 1: us ob comp less 14 wks · 14 of 40 slices shown]
[im 2/40]
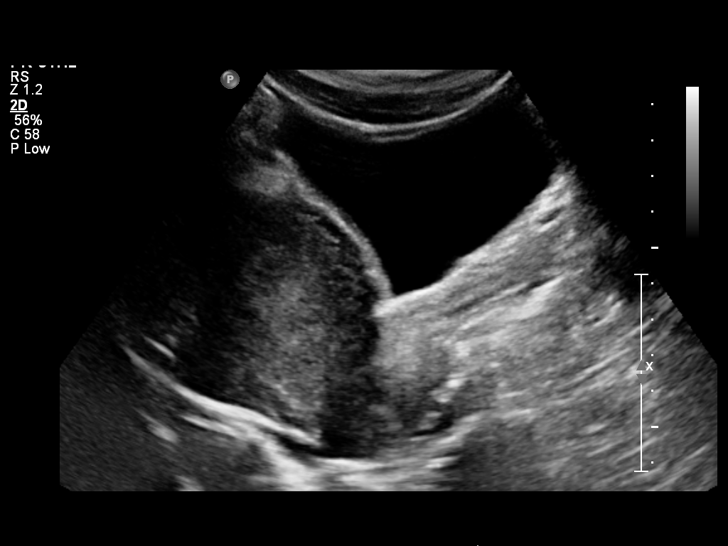
[im 5/40]
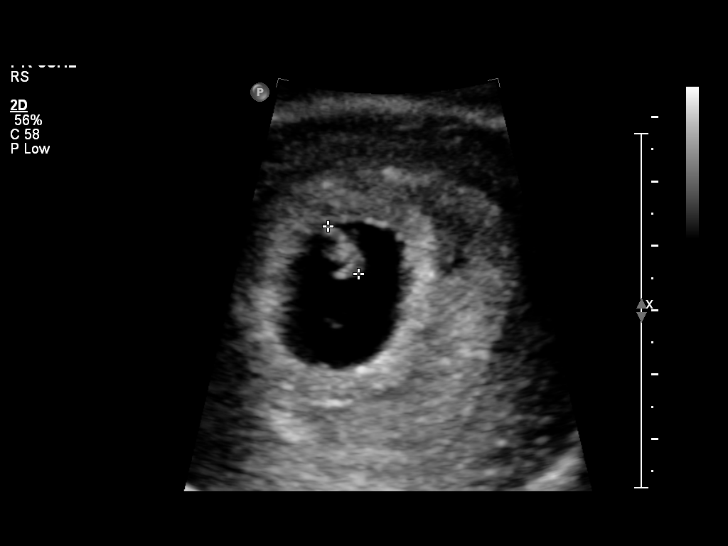
[im 8/40]
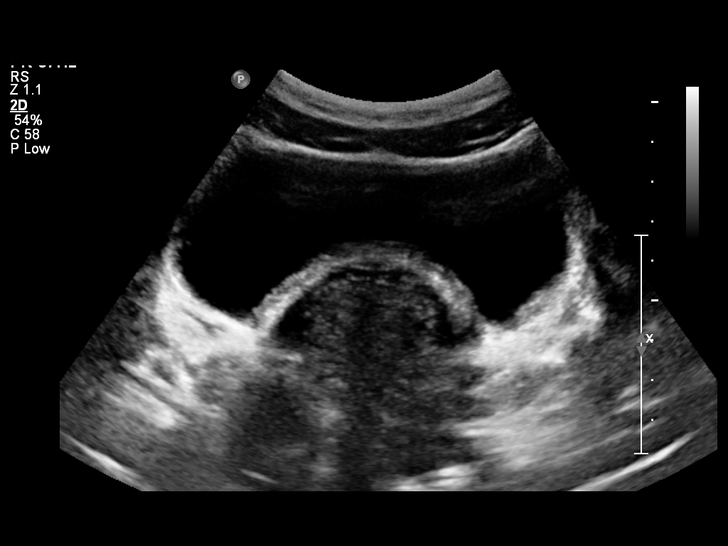
[im 11/40]
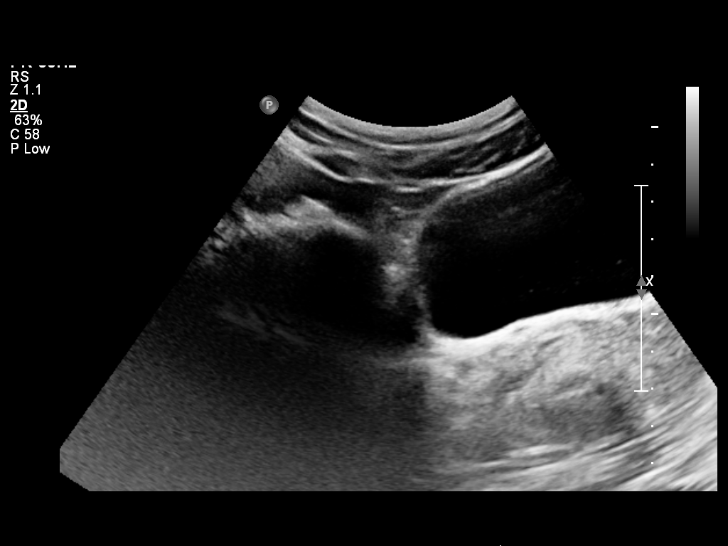
[im 14/40]
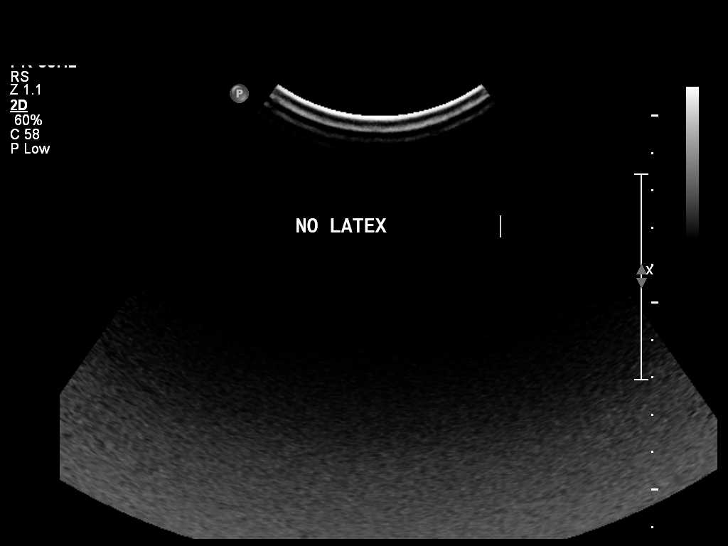
[im 16/40]
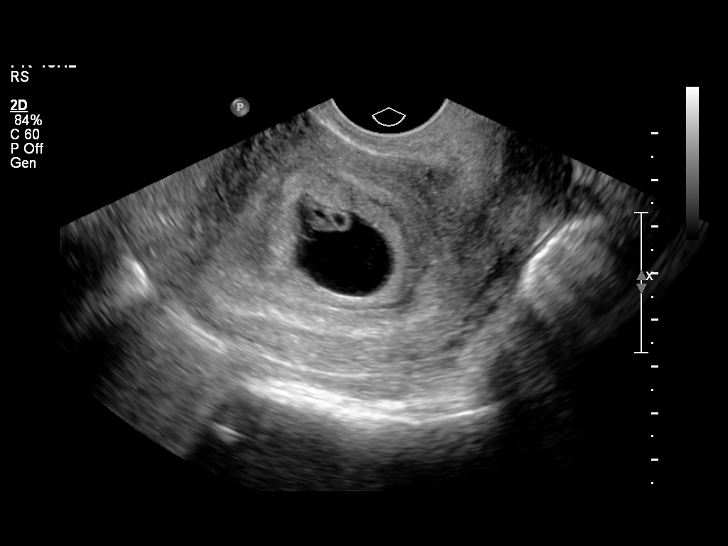
[im 19/40]
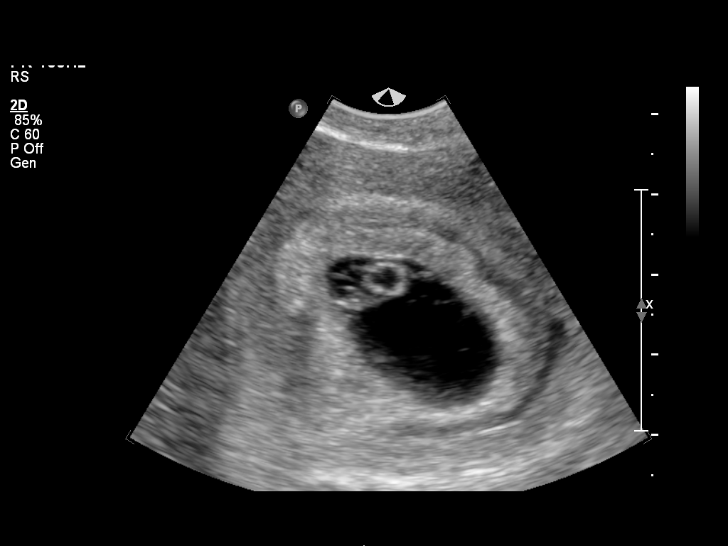
[im 22/40]
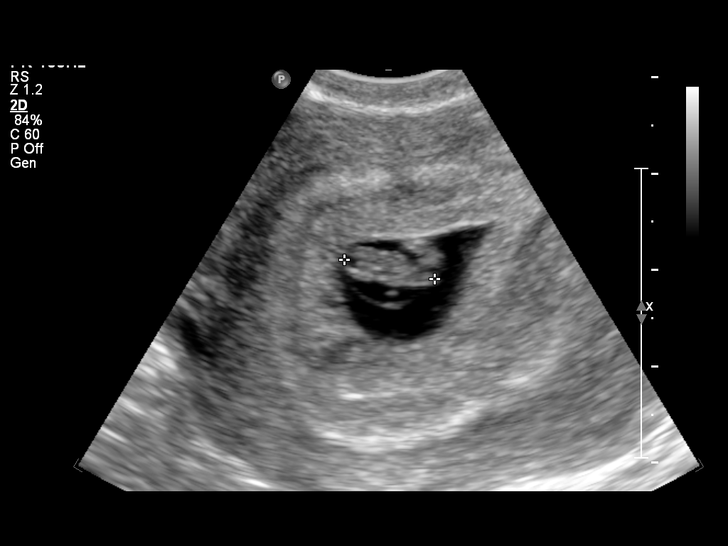
[im 25/40]
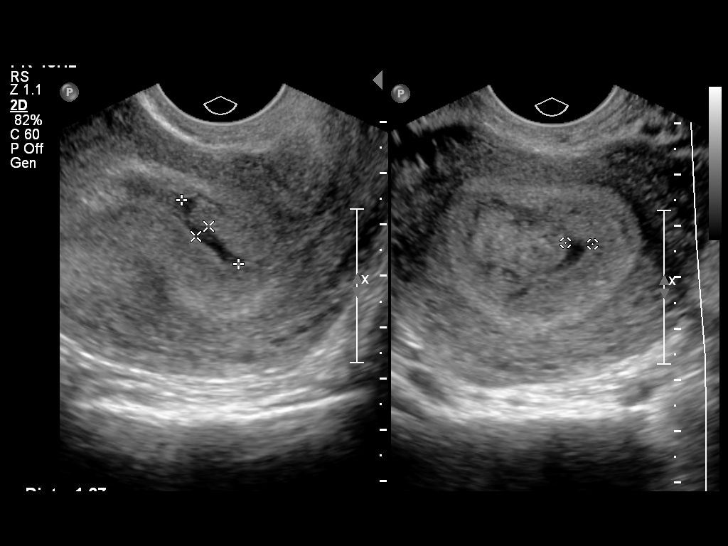
[im 28/40]
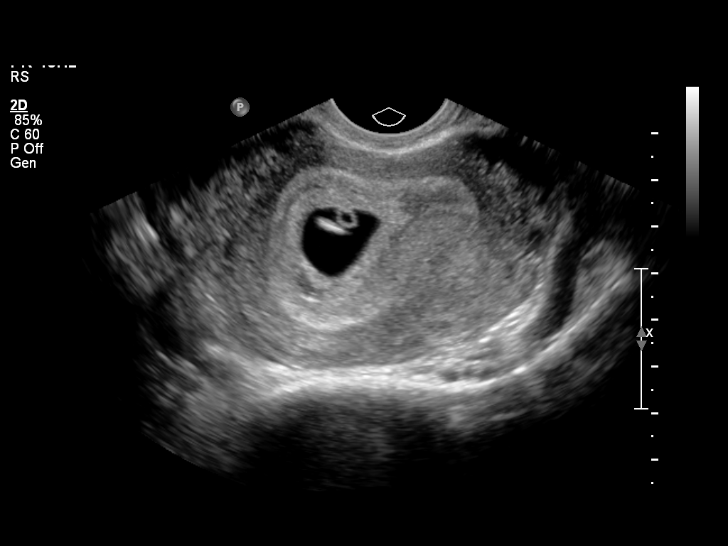
[im 31/40]
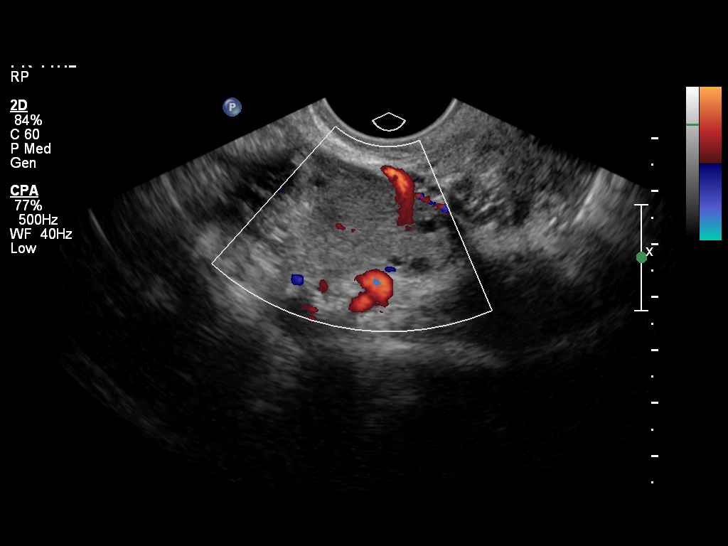
[im 34/40]
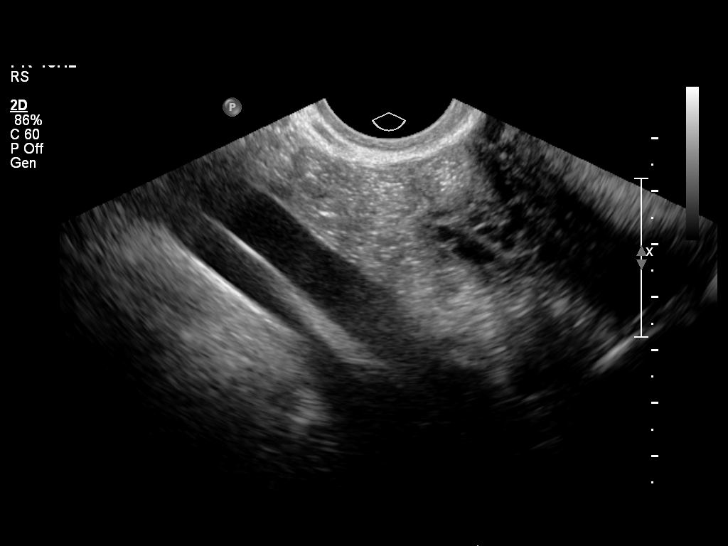
[im 37/40]
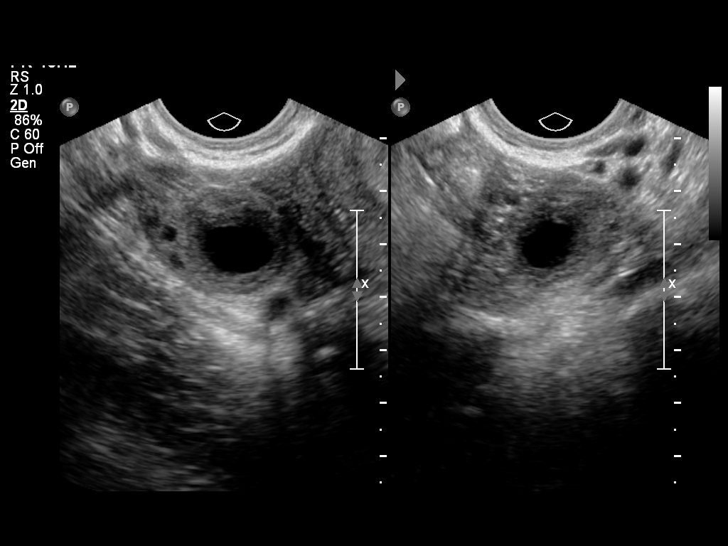
[im 40/40]
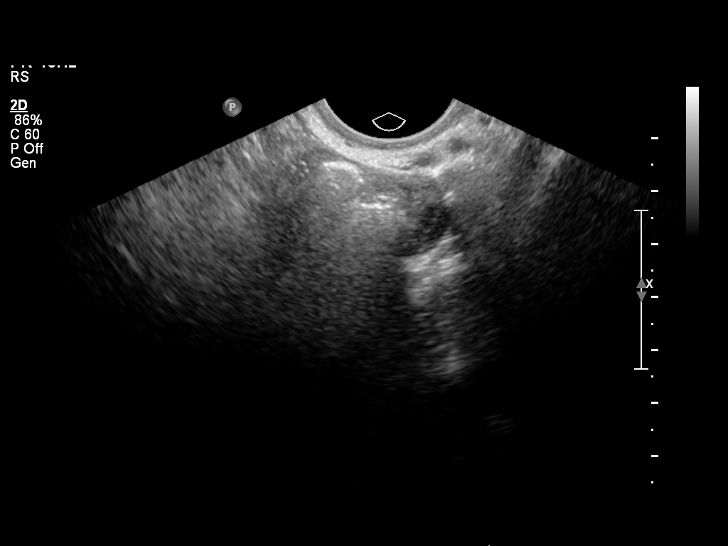

[14 of 28 positions shown; findings below may reference images not displayed]

FINDINGS: Intrauterine gestational sac: Visualized/normal in shape.

Yolk sac:  Visualized

Embryo:  Visualized

Cardiac Activity: Visualized

Heart Rate: 124  bpm

MSD:   mm    w     d

CRL:  9.4  mm   7 w   0 d                  US EDC: 07/17/2015

Maternal uterus/adnexae: Small subchorionic hemorrhage measuring
x 0.5 x 0.3 cm. No adnexal masses or free fluid. Left corpus luteum
cyst.
IMPRESSION: Seven week intrauterine pregnancy. Fetal heart rate 124 beats per
minute. Small subchorionic hemorrhage.

## 2015-05-24 ENCOUNTER — Encounter (HOSPITAL_COMMUNITY): Payer: Self-pay

## 2015-05-24 ENCOUNTER — Emergency Department (HOSPITAL_COMMUNITY): Payer: BLUE CROSS/BLUE SHIELD

## 2015-05-24 ENCOUNTER — Emergency Department (HOSPITAL_COMMUNITY)
Admission: EM | Admit: 2015-05-24 | Discharge: 2015-05-24 | Disposition: A | Discharge status: Home / Self Care | Payer: BLUE CROSS/BLUE SHIELD | Attending: Emergency Medicine | Admitting: Emergency Medicine

## 2015-05-24 DIAGNOSIS — Z872 Personal history of diseases of the skin and subcutaneous tissue: Secondary | ICD-10-CM | POA: Insufficient documentation

## 2015-05-24 DIAGNOSIS — Z3202 Encounter for pregnancy test, result negative: Secondary | ICD-10-CM | POA: Insufficient documentation

## 2015-05-24 DIAGNOSIS — Z8719 Personal history of other diseases of the digestive system: Secondary | ICD-10-CM | POA: Diagnosis not present

## 2015-05-24 DIAGNOSIS — Z8619 Personal history of other infectious and parasitic diseases: Secondary | ICD-10-CM | POA: Diagnosis not present

## 2015-05-24 DIAGNOSIS — Z975 Presence of (intrauterine) contraceptive device: Secondary | ICD-10-CM | POA: Insufficient documentation

## 2015-05-24 DIAGNOSIS — R110 Nausea: Secondary | ICD-10-CM | POA: Insufficient documentation

## 2015-05-24 DIAGNOSIS — Z79899 Other long term (current) drug therapy: Secondary | ICD-10-CM | POA: Diagnosis not present

## 2015-05-24 DIAGNOSIS — R1030 Lower abdominal pain, unspecified: Secondary | ICD-10-CM | POA: Diagnosis present

## 2015-05-24 DIAGNOSIS — R102 Pelvic and perineal pain: Secondary | ICD-10-CM | POA: Diagnosis not present

## 2015-05-24 DIAGNOSIS — I959 Hypotension, unspecified: Secondary | ICD-10-CM | POA: Insufficient documentation

## 2015-05-24 LAB — WET PREP, GENITAL
Clue Cells Wet Prep HPF POC: NONE SEEN
Trich, Wet Prep: NONE SEEN
Yeast Wet Prep HPF POC: NONE SEEN

## 2015-05-24 LAB — URINALYSIS, ROUTINE W REFLEX MICROSCOPIC
Bilirubin Urine: NEGATIVE
Glucose, UA: NEGATIVE mg/dL
Ketones, ur: NEGATIVE mg/dL
Leukocytes, UA: NEGATIVE
Nitrite: NEGATIVE
Protein, ur: NEGATIVE mg/dL
Specific Gravity, Urine: 1.024 (ref 1.005–1.030)
Urobilinogen, UA: 0.2 mg/dL (ref 0.0–1.0)
pH: 6 (ref 5.0–8.0)

## 2015-05-24 LAB — URINE MICROSCOPIC-ADD ON

## 2015-05-24 LAB — I-STAT BETA HCG BLOOD, ED (MC, WL, AP ONLY): I-stat hCG, quantitative: <5 m[IU]/mL (ref <5)

## 2015-05-24 MED ORDER — KETOROLAC TROMETHAMINE 30 MG/ML IJ SOLN
30.0000 mg | Freq: Once | INTRAMUSCULAR | Status: AC
  Administered 2015-05-24: 30 mg via INTRAVENOUS
  Filled 2015-05-24: qty 1

## 2015-05-24 MED ORDER — ONDANSETRON HCL 4 MG/2ML IJ SOLN
4.0000 mg | Freq: Once | INTRAMUSCULAR | Status: AC
  Administered 2015-05-24: 4 mg via INTRAVENOUS
  Filled 2015-05-24: qty 2

## 2015-05-24 NOTE — ED Notes (Signed)
Pt had an IUD placed in February and she states that she's had problems with it since then, it's not suddenly hurting her really badly and she feels nauseated, she has talked to her OBGYN and they just give her ibuprofen for the discomfort

## 2015-05-24 NOTE — ED Notes (Signed)
Discharge instructions given and reviewed with patient.  Patient verbalized understanding to follow up with GYN.  Patient discharged home in good condition.

## 2015-05-24 NOTE — Discharge Instructions (Signed)
Please continue to take motrin and tylenol for pain control. Follow-up with your gynecologist regarding taking out your IUD. Return for worsening symptoms, including fever, vomiting and unable to keep down food/fluids, worsening pain, or any other symptoms concerning to you.   Abdominal Pain, Women Abdominal (stomach, pelvic, or belly) pain can be caused by many things. It is important to tell your doctor:  The location of the pain.  Does it come and go or is it present all the time?  Are there things that start the pain (eating certain foods, exercise)?  Are there other symptoms associated with the pain (fever, nausea, vomiting, diarrhea)? All of this is helpful to know when trying to find the cause of the pain. CAUSES   Stomach: virus or bacteria infection, or ulcer.  Intestine: appendicitis (inflamed appendix), regional ileitis (Crohn's disease), ulcerative colitis (inflamed colon), irritable bowel syndrome, diverticulitis (inflamed diverticulum of the colon), or cancer of the stomach or intestine.  Gallbladder disease or stones in the gallbladder.  Kidney disease, kidney stones, or infection.  Pancreas infection or cancer.  Fibromyalgia (pain disorder).  Diseases of the female organs:  Uterus: fibroid (non-cancerous) tumors or infection.  Fallopian tubes: infection or tubal pregnancy.  Ovary: cysts or tumors.  Pelvic adhesions (scar tissue).  Endometriosis (uterus lining tissue growing in the pelvis and on the pelvic organs).  Pelvic congestion syndrome (female organs filling up with blood just before the menstrual period).  Pain with the menstrual period.  Pain with ovulation (producing an egg).  Pain with an IUD (intrauterine device, birth control) in the uterus.  Cancer of the female organs.  Functional pain (pain not caused by a disease, may improve without treatment).  Psychological pain.  Depression. DIAGNOSIS  Your doctor will decide the seriousness  of your pain by doing an examination.  Blood tests.  X-rays.  Ultrasound.  CT scan (computed tomography, special type of X-ray).  MRI (magnetic resonance imaging).  Cultures, for infection.  Barium enema (dye inserted in the large intestine, to better view it with X-rays).  Colonoscopy (looking in intestine with a lighted tube).  Laparoscopy (minor surgery, looking in abdomen with a lighted tube).  Major abdominal exploratory surgery (looking in abdomen with a large incision). TREATMENT  The treatment will depend on the cause of the pain.   Many cases can be observed and treated at home.  Over-the-counter medicines recommended by your caregiver.  Prescription medicine.  Antibiotics, for infection.  Birth control pills, for painful periods or for ovulation pain.  Hormone treatment, for endometriosis.  Nerve blocking injections.  Physical therapy.  Antidepressants.  Counseling with a psychologist or psychiatrist.  Minor or major surgery. HOME CARE INSTRUCTIONS   Do not take laxatives, unless directed by your caregiver.  Take over-the-counter pain medicine only if ordered by your caregiver. Do not take aspirin because it can cause an upset stomach or bleeding.  Try a clear liquid diet (broth or water) as ordered by your caregiver. Slowly move to a bland diet, as tolerated, if the pain is related to the stomach or intestine.  Have a thermometer and take your temperature several times a day, and record it.  Bed rest and sleep, if it helps the pain.  Avoid sexual intercourse, if it causes pain.  Avoid stressful situations.  Keep your follow-up appointments and tests, as your caregiver orders.  If the pain does not go away with medicine or surgery, you may try:  Acupuncture.  Relaxation exercises (yoga, meditation).  Group  therapy.  Counseling. SEEK MEDICAL CARE IF:   You notice certain foods cause stomach pain.  Your home care treatment is not  helping your pain.  You need stronger pain medicine.  You want your IUD removed.  You feel faint or lightheaded.  You develop nausea and vomiting.  You develop a rash.  You are having side effects or an allergy to your medicine. SEEK IMMEDIATE MEDICAL CARE IF:   Your pain does not go away or gets worse.  You have a fever.  Your pain is felt only in portions of the abdomen. The right side could possibly be appendicitis. The left lower portion of the abdomen could be colitis or diverticulitis.  You are passing blood in your stools (bright red or black tarry stools, with or without vomiting).  You have blood in your urine.  You develop chills, with or without a fever.  You pass out. MAKE SURE YOU:   Understand these instructions.  Will watch your condition.  Will get help right away if you are not doing well or get worse. Document Released: 07/15/2007 Document Revised: 02/01/2014 Document Reviewed: 08/04/2009 [facility] Patient Information 2015 [place], [place]. This information is not intended to replace advice given to you by your health care provider. Make sure you discuss any questions you have with your health care provider.

## 2015-05-24 NOTE — ED Provider Notes (Signed)
CSN: [id number]     Arrival date & time 05/24/15  0358 History   First MD Initiated Contact with Patient 05/24/15 [phone]     Chief Complaint  Patient presents with  . Abdominal Pain     (Consider location/radiation/quality/duration/timing/severity/associated sxs/prior Treatment) HPI 19 year old female who presents with low abdominal pain. Otherwise healthy and no prior abdominal surgeries. Pain started at 3 AM this morning, which woke her up from sleep. Initially generalized, but now localized to low abdomen and associated with nausea. She has had pain similar to this but less severe which she has attributed to her IUD placement in February. However, pain usually goes away on it's own, and this has not. Has vaginal bleeding associated with sexual intercourse. Brown vaginal discharge which is new. No dyspareunia. Denies dysuria or urinary frequency, diarrhea, vomiting, fever.   Past Medical History  Diagnosis Date  . Hypotension   . Intestinal infection due to bacteria   . GERD (gastroesophageal reflux disease)   . Cellulitis of arm, left     surgically debreeded and packed.   Past Surgical History  Procedure Laterality Date  . Left arm      cellulitis   Family History  Problem Relation Age of Onset  . Cancer Mother   . Diabetes Mother   . Hyperlipidemia Father    Social History  Substance Use Topics  . Smoking status: Never Smoker   . Smokeless tobacco: None  . Alcohol Use: No   OB History    Gravida Para Term Preterm AB TAB SAB Ectopic Multiple Living   2              Review of Systems 10/14 systems reviewed and are negative other than those stated in the HPI   Allergies  Evekeo  Home Medications   Prior to Admission medications   Medication Sig Start Date End Date Taking? Authorizing Provider  ibuprofen (ADVIL,MOTRIN) 800 MG tablet Take 800 mg by mouth every 8 (eight) hours as needed for mild pain or moderate pain.   Yes Historical Provider, MD  levonorgestrel  (MIRENA) 20 MCG/24HR IUD 1 each by Intrauterine route once.   Yes Historical Provider, MD  Amphetamine Sulfate (EVEKEO) 10 MG TABS Take 10 mg by mouth 2 (two) times daily.    Historical Provider, MD   BP 97/57 mmHg  Pulse 61  Temp(Src) 98.4 F (36.9 C) (Oral)  Resp 18  Ht 5\' 8"  (1.727 m)  Wt 130 lb (58.968 kg)  BMI 19.77 kg/m2  SpO2 99%  LMP 09/24/2014 Physical Exam  Physical Exam  Nursing note and vitals reviewed. Constitutional: Well developed, well nourished, non-toxic, and in no acute distress Head: Normocephalic and atraumatic.  Mouth/Throat: Oropharynx is clear and moist.  Neck: Normal range of motion. Neck supple.  Cardiovascular: Normal rate and regular rhythm. No edema.   Pulmonary/Chest: Effort normal and breath sounds normal.  Abdominal: Soft. There is no tenderness at McBurney's point. No CVA tenderness. Negative murphy's sign. Suprapubic tenderness to palpation. There is no rebound and no guarding.  Musculoskeletal: Normal range of motion.  Neurological: Alert, no facial droop, fluent speech, moves all extremities symmetrically Skin: Skin is warm and dry.  Psychiatric: Cooperative Pelvic: Normal external genitalia. Normal internal genitalia. Brown vaginal discharge. Dried brown blood within the vagina. No cervical motion tenderness. No adnexal masses or tenderness. IUD string appears to be in place.  ED Course  Procedures (including critical care time) Labs Review Labs Reviewed  WET PREP, GENITAL -  Abnormal; Notable for the following:    WBC, Wet Prep HPF POC FEW (*)    All other components within normal limits  URINALYSIS, ROUTINE W REFLEX MICROSCOPIC (NOT AT [facility]) - Abnormal; Notable for the following:    Hgb urine dipstick TRACE (*)    All other components within normal limits  URINE MICROSCOPIC-ADD ON  I-STAT BETA HCG BLOOD, ED ([facility], [facility], [facility] ONLY)  GC/CHLAMYDIA PROBE AMP ([place]) NOT AT [facility]    Imaging Review US Transvaginal Non-ob  05/24/2015    CLINICAL DATA:  Acute pelvic pain generalized.  IUD.  EXAM: TRANSABDOMINAL AND TRANSVAGINAL ULTRASOUND OF PELVIS  DOPPLER ULTRASOUND OF OVARIES  TECHNIQUE: Both transabdominal and transvaginal ultrasound examinations of the pelvis were performed. Transabdominal technique was performed for global imaging of the pelvis including uterus, ovaries, adnexal regions, and pelvic cul-de-sac.  It was necessary to proceed with endovaginal exam following the transabdominal exam to visualize the endometrium and ovaries. Color and duplex Doppler ultrasound was utilized to evaluate blood flow to the ovaries.  COMPARISON:  None.  FINDINGS: Uterus  Measurements: 3.4 x 4.7 x 8.0 cm. No fibroids or other mass visualized.  Endometrium  Thickness: 2.3 mm. No focal abnormality visualized. IUD is located over the endometrial cavity at the level of the body of the uterus.  Right ovary  Measurements: 3.6 x 4.0 x 6.0 cm. Simple appearing 5.1 cm cyst.  Left ovary  Measurements: 2.0 x 2.4 x 3.7 cm. Normal appearance/no adnexal mass.  Pulsed Doppler evaluation of both ovaries demonstrates normal low-resistance arterial and venous waveforms.  Other findings  No free fluid.  IMPRESSION: No evidence of ovarian torsion.  5.1 cm simple appearing right ovarian cyst likely a follicular cyst. This is almost certainly benign, but follow up ultrasound is recommended in 1 year according to the Society of Radiologists in Ultrasound2010 Consensus Conference Statement (D Lenis Noon et al. Management of Asymptomatic Ovarian and Other Adnexal Cysts Imaged at [place]: Society of Radiologists in Ultrasound Consensus Conference Statement 2010. Radiology 256 (Sept 2010): [phone].).  Normal-appearing uterus with IUD over the endometrial cavity at the level of the body of the uterus.   Electronically Signed   By: Elberta Fortis M.D.   On: 05/24/2015 08:36   US Pelvis Complete  05/24/2015   CLINICAL DATA:  Acute pelvic pain generalized.  IUD.  EXAM: TRANSABDOMINAL AND  TRANSVAGINAL ULTRASOUND OF PELVIS  DOPPLER ULTRASOUND OF OVARIES  TECHNIQUE: Both transabdominal and transvaginal ultrasound examinations of the pelvis were performed. Transabdominal technique was performed for global imaging of the pelvis including uterus, ovaries, adnexal regions, and pelvic cul-de-sac.  It was necessary to proceed with endovaginal exam following the transabdominal exam to visualize the endometrium and ovaries. Color and duplex Doppler ultrasound was utilized to evaluate blood flow to the ovaries.  COMPARISON:  None.  FINDINGS: Uterus  Measurements: 3.4 x 4.7 x 8.0 cm. No fibroids or other mass visualized.  Endometrium  Thickness: 2.3 mm. No focal abnormality visualized. IUD is located over the endometrial cavity at the level of the body of the uterus.  Right ovary  Measurements: 3.6 x 4.0 x 6.0 cm. Simple appearing 5.1 cm cyst.  Left ovary  Measurements: 2.0 x 2.4 x 3.7 cm. Normal appearance/no adnexal mass.  Pulsed Doppler evaluation of both ovaries demonstrates normal low-resistance arterial and venous waveforms.  Other findings  No free fluid.  IMPRESSION: No evidence of ovarian torsion.  5.1 cm simple appearing right ovarian cyst likely a follicular cyst. This is  almost certainly benign, but follow up ultrasound is recommended in 1 year according to the Society of Radiologists in Ultrasound2010 Consensus Conference Statement (D Lenis Noon et al. Management of Asymptomatic Ovarian and Other Adnexal Cysts Imaged at [place]: Society of Radiologists in Ultrasound Consensus Conference Statement 2010. Radiology 256 (Sept 2010): [phone].).  Normal-appearing uterus with IUD over the endometrial cavity at the level of the body of the uterus.   Electronically Signed   By: Elberta Fortis M.D.   On: 05/24/2015 08:36   [place] Art/ven Flow Abd Pelv Doppler  05/24/2015   CLINICAL DATA:  Acute pelvic pain generalized.  IUD.  EXAM: TRANSABDOMINAL AND TRANSVAGINAL ULTRASOUND OF PELVIS  DOPPLER ULTRASOUND OF OVARIES   TECHNIQUE: Both transabdominal and transvaginal ultrasound examinations of the pelvis were performed. Transabdominal technique was performed for global imaging of the pelvis including uterus, ovaries, adnexal regions, and pelvic cul-de-sac.  It was necessary to proceed with endovaginal exam following the transabdominal exam to visualize the endometrium and ovaries. Color and duplex Doppler ultrasound was utilized to evaluate blood flow to the ovaries.  COMPARISON:  None.  FINDINGS: Uterus  Measurements: 3.4 x 4.7 x 8.0 cm. No fibroids or other mass visualized.  Endometrium  Thickness: 2.3 mm. No focal abnormality visualized. IUD is located over the endometrial cavity at the level of the body of the uterus.  Right ovary  Measurements: 3.6 x 4.0 x 6.0 cm. Simple appearing 5.1 cm cyst.  Left ovary  Measurements: 2.0 x 2.4 x 3.7 cm. Normal appearance/no adnexal mass.  Pulsed Doppler evaluation of both ovaries demonstrates normal low-resistance arterial and venous waveforms.  Other findings  No free fluid.  IMPRESSION: No evidence of ovarian torsion.  5.1 cm simple appearing right ovarian cyst likely a follicular cyst. This is almost certainly benign, but follow up ultrasound is recommended in 1 year according to the Society of Radiologists in Ultrasound2010 Consensus Conference Statement (D Lenis Noon et al. Management of Asymptomatic Ovarian and Other Adnexal Cysts Imaged at [place]: Society of Radiologists in Ultrasound Consensus Conference Statement 2010. Radiology 256 (Sept 2010): [phone].).  Normal-appearing uterus with IUD over the endometrial cavity at the level of the body of the uterus.   Electronically Signed   By: Elberta Fortis M.D.   On: 05/24/2015 08:36   I have personally reviewed and evaluated these images and lab results as part of my medical decision-making.   MDM   Final diagnoses:  Pelvic pain in female    19 year old female, otherwise healthy, who presents with recurrent suprapubic abdominal pain  in the setting of IUD placement several months ago. She is nontoxic and in no acute distress on presentation. Vital signs are within normal limits. She has a soft and nonsurgical abdomen with focal suprapubic tenderness to palpation. IUD appears to be in place on pelvic exam. No major cervical motion tenderness or axial tenderness to suggest underlying PID. STI studies and wet prep are sent. No evidence of BV or Trichomonas. she is not pregnant. She will undergo pelvic ultrasound to confirm IUD placement and rule out any other acute intrapelvic processes. This is visualized and reveals a right ovarian cyst, with good flow, and is unlikely to be the etiology of her pain that she has no pain over her right lower quadrant or right adnexa. IUD appears to be in place. She will then follow-up with  her gynecologist in [place] . Strict return instructions are reviewed. She expressed understanding of all discharge instructions and felt comfortable to plan of  care.  Lavera Guise, MD 05/24/15 [phone]

## 2015-05-25 LAB — GC/CHLAMYDIA PROBE AMP (~~LOC~~) NOT AT ARMC
Chlamydia: NEGATIVE
Neisseria Gonorrhea: NEGATIVE

## 2015-10-19 IMAGING — US US TRANSVAGINAL NON-OB
1 series · 13 of 25 positions shown · non-contrast
Comparison: None.

CLINICAL DATA: Acute pelvic pain generalized.  IUD.

EXAM:
TRANSABDOMINAL AND TRANSVAGINAL ULTRASOUND OF PELVIS
DOPPLER ULTRASOUND OF OVARIES
TECHNIQUE: Both transabdominal and transvaginal ultrasound examinations of the
pelvis were performed. Transabdominal technique was performed for
global imaging of the pelvis including uterus, ovaries, adnexal
regions, and pelvic cul-de-sac.
It was necessary to proceed with endovaginal exam following the
transabdominal exam to visualize the endometrium and ovaries. Color
and duplex Doppler ultrasound was utilized to evaluate blood flow to
the ovaries.

[Series 1: us transvaginal non-ob · 0.21mm/px · 13 of 92 slices shown]
[im 1/92]
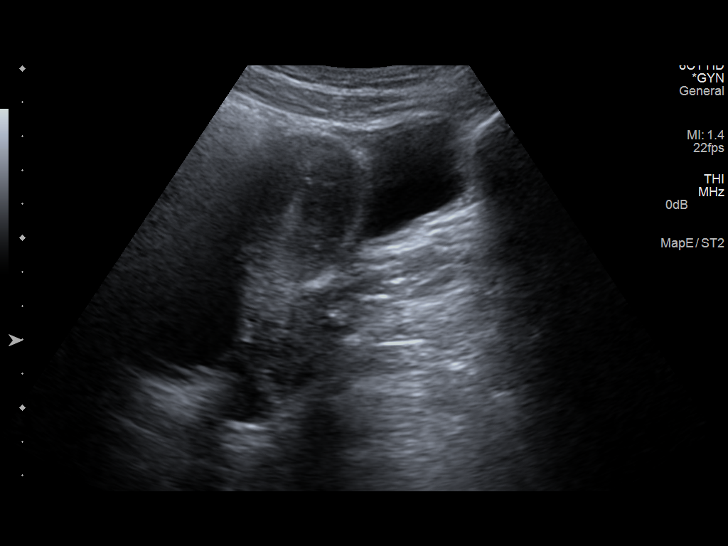
[im 8/92]
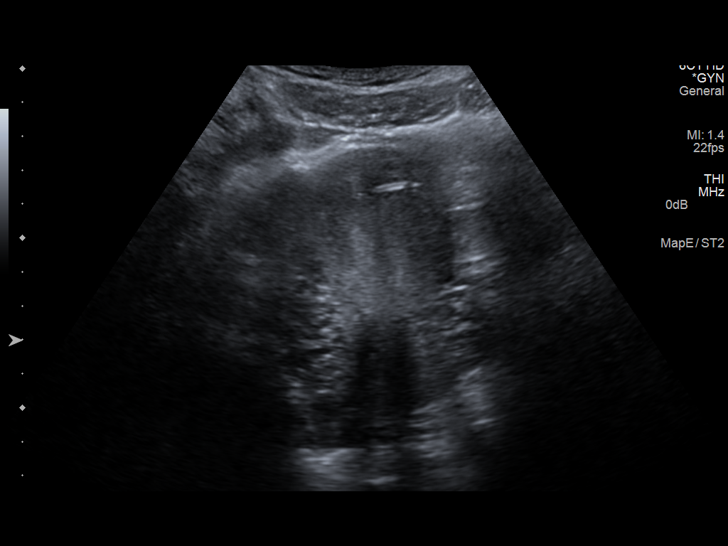
[im 16/92]
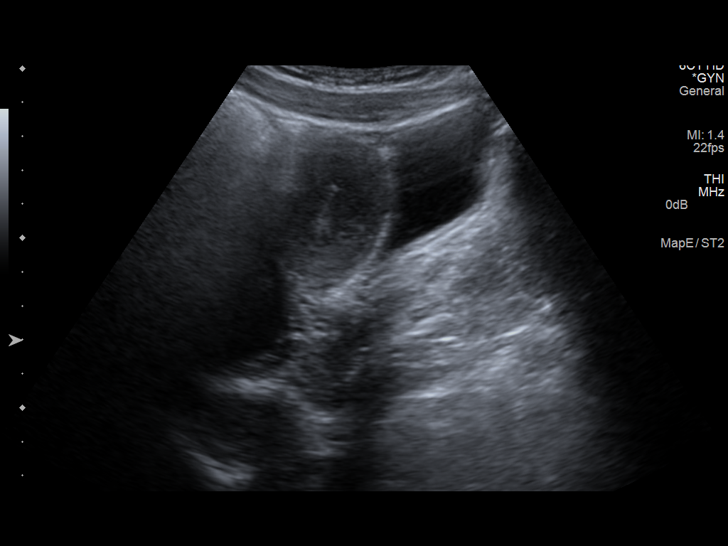
[im 23/92]
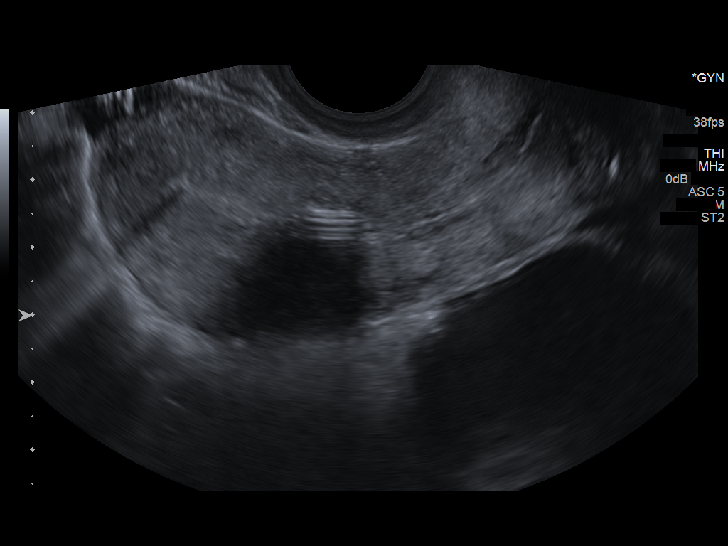
[im 31/92]
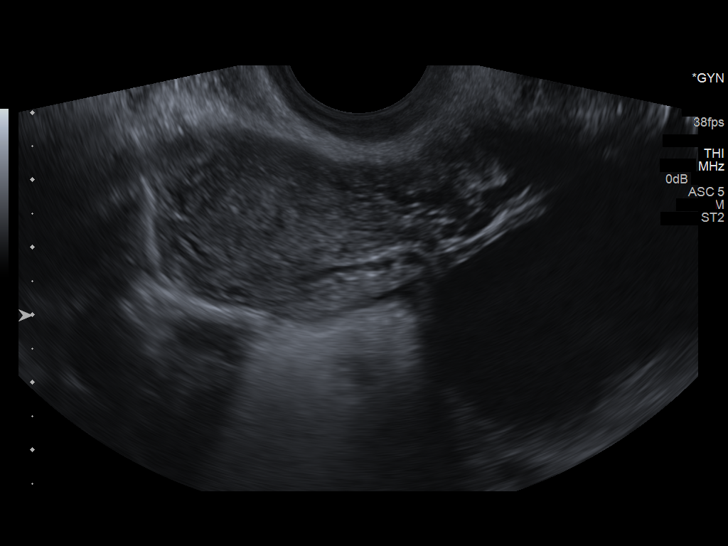
[im 38/92]
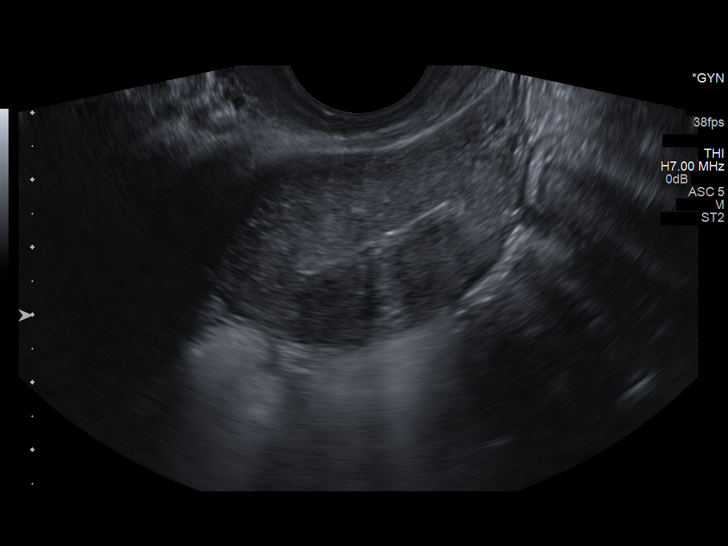
[im 46/92]
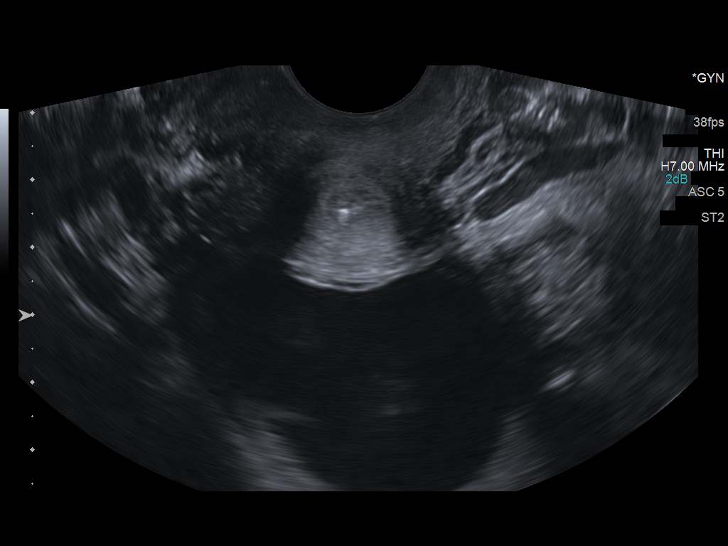
[im 54/92]
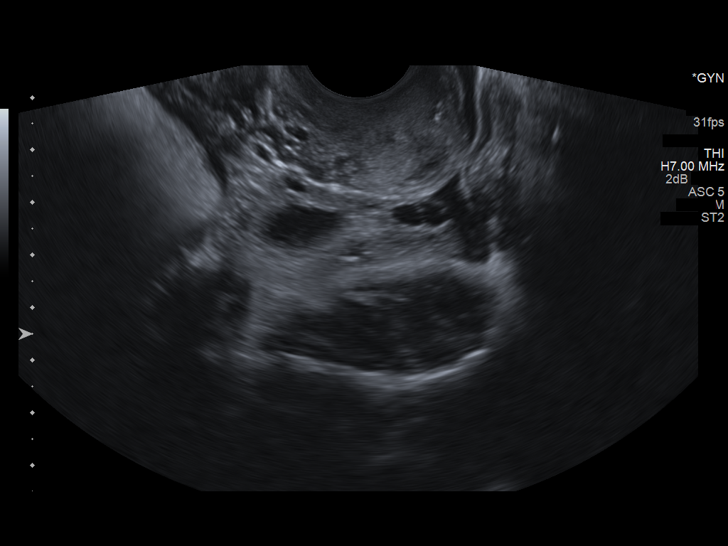
[im 61/92]
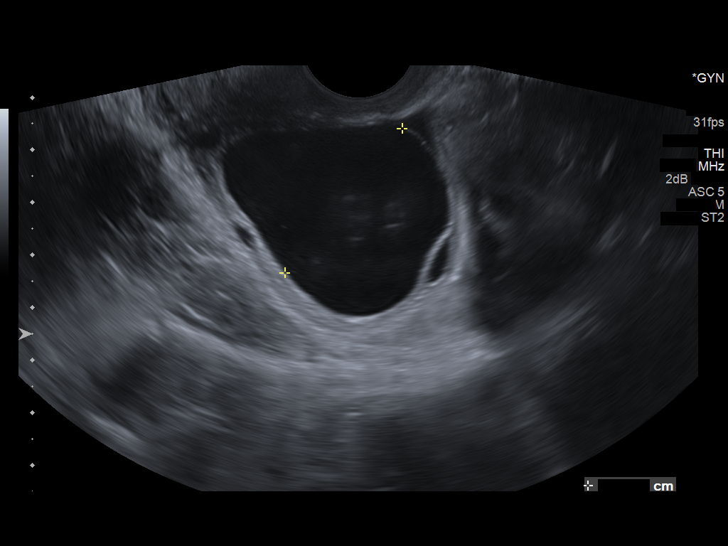
[im 69/92]
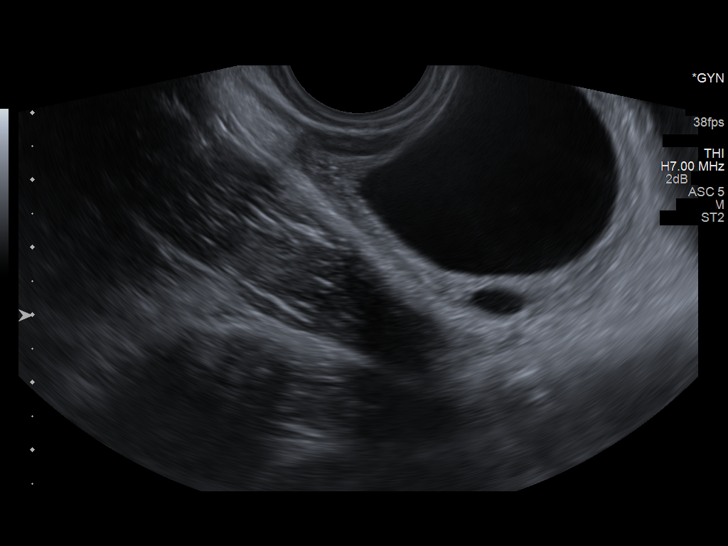
[im 76/92]
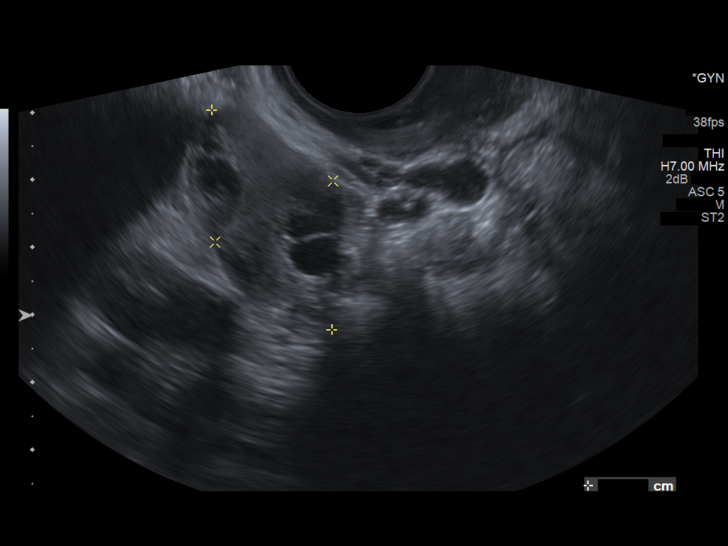
[im 84/92]
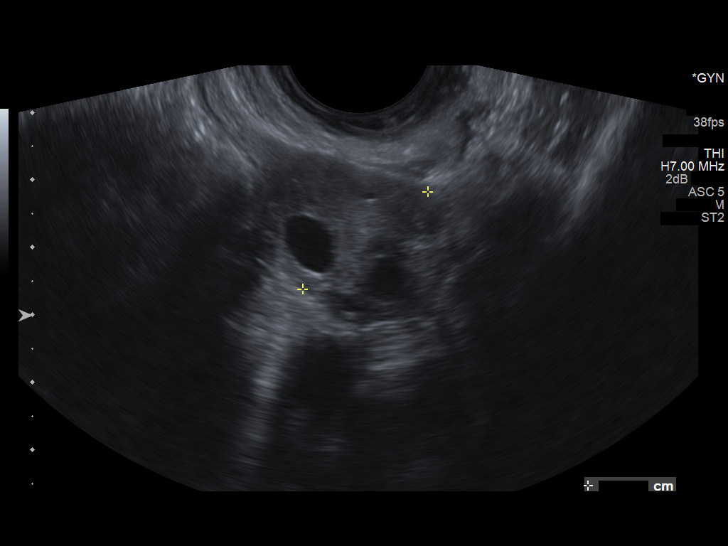
[im 92/92]
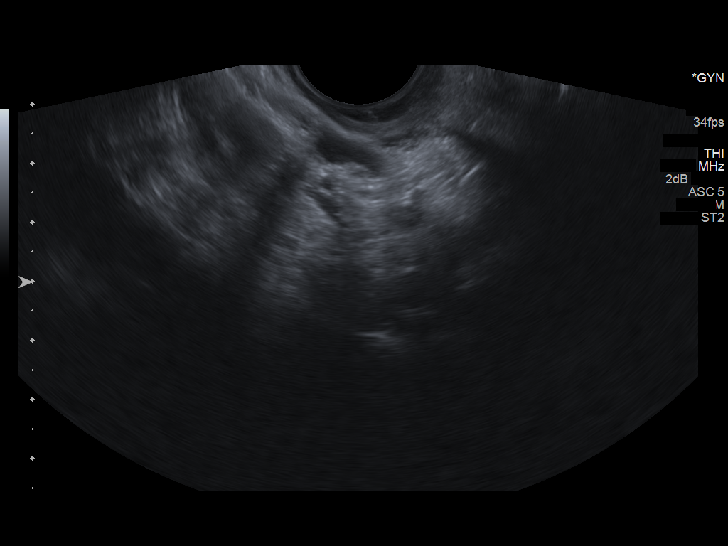

[13 of 25 positions shown; findings below may reference images not displayed]

FINDINGS: Uterus

Measurements: 3.4 x 4.7 x 8.0 cm. No fibroids or other mass
visualized.

Endometrium

Thickness: 2.3 mm. No focal abnormality visualized. IUD is located
over the endometrial cavity at the level of the body of the uterus.

Right ovary

Measurements: 3.6 x 4.0 x 6.0 cm. Simple appearing 5.1 cm cyst.

Left ovary

Measurements: 2.0 x 2.4 x 3.7 cm. Normal appearance/no adnexal mass.

Pulsed Doppler evaluation of both ovaries demonstrates normal
low-resistance arterial and venous waveforms.

Other findings

No free fluid.
IMPRESSION: No evidence of ovarian torsion.

5.1 cm simple appearing right ovarian cyst likely a follicular cyst.
This is almost certainly benign, but follow up ultrasound is
recommended in 1 year according to the Society of Radiologists in
[employer] Consensus Conference Statement (Mads Bo Pasqualato et al.
Management of Asymptomatic Ovarian and Other Adnexal Cysts Imaged at
[place]: Society of Radiologists in Ultrasound Consensus Conference
Statement 3818. Radiology [DATE]): [phone].).

Normal-appearing uterus with IUD over the endometrial cavity at the
level of the body of the uterus.

## 2015-11-08 ENCOUNTER — Encounter (HOSPITAL_COMMUNITY): Payer: Self-pay

## 2015-11-08 ENCOUNTER — Emergency Department (HOSPITAL_COMMUNITY): Payer: BLUE CROSS/BLUE SHIELD

## 2015-11-08 ENCOUNTER — Emergency Department (HOSPITAL_COMMUNITY)
Admission: EM | Admit: 2015-11-08 | Discharge: 2015-11-08 | Disposition: A | Discharge status: Home / Self Care | Payer: BLUE CROSS/BLUE SHIELD | Attending: Emergency Medicine | Admitting: Emergency Medicine

## 2015-11-08 DIAGNOSIS — Z3202 Encounter for pregnancy test, result negative: Secondary | ICD-10-CM | POA: Insufficient documentation

## 2015-11-08 DIAGNOSIS — R102 Pelvic and perineal pain: Secondary | ICD-10-CM

## 2015-11-08 DIAGNOSIS — Z872 Personal history of diseases of the skin and subcutaneous tissue: Secondary | ICD-10-CM | POA: Insufficient documentation

## 2015-11-08 DIAGNOSIS — N83209 Unspecified ovarian cyst, unspecified side: Secondary | ICD-10-CM

## 2015-11-08 DIAGNOSIS — N83201 Unspecified ovarian cyst, right side: Secondary | ICD-10-CM | POA: Diagnosis not present

## 2015-11-08 DIAGNOSIS — R110 Nausea: Secondary | ICD-10-CM

## 2015-11-08 DIAGNOSIS — R1031 Right lower quadrant pain: Secondary | ICD-10-CM

## 2015-11-08 DIAGNOSIS — Z8679 Personal history of other diseases of the circulatory system: Secondary | ICD-10-CM | POA: Insufficient documentation

## 2015-11-08 DIAGNOSIS — Z8619 Personal history of other infectious and parasitic diseases: Secondary | ICD-10-CM | POA: Diagnosis not present

## 2015-11-08 DIAGNOSIS — R52 Pain, unspecified: Secondary | ICD-10-CM

## 2015-11-08 DIAGNOSIS — Z8719 Personal history of other diseases of the digestive system: Secondary | ICD-10-CM | POA: Diagnosis not present

## 2015-11-08 LAB — COMPREHENSIVE METABOLIC PANEL
ALT: 13 U/L — ABNORMAL LOW (ref 14–54)
AST: 21 U/L (ref 15–41)
Albumin: 4.6 g/dL (ref 3.5–5.0)
Alkaline Phosphatase: 66 U/L (ref 38–126)
Anion gap: 7 (ref 5–15)
BUN: 20 mg/dL (ref 6–20)
CO2: 24 mmol/L (ref 22–32)
Calcium: 9.1 mg/dL (ref 8.9–10.3)
Chloride: 109 mmol/L (ref 101–111)
Creatinine, Ser: 0.84 mg/dL (ref 0.44–1.00)
GFR calc Af Amer: >60 mL/min (ref >60)
GFR calc non Af Amer: >60 mL/min (ref >60)
Glucose, Bld: 105 mg/dL — ABNORMAL HIGH (ref 65–99)
Potassium: 3.5 mmol/L (ref 3.5–5.1)
Sodium: 140 mmol/L (ref 135–145)
Total Bilirubin: 0.4 mg/dL (ref 0.3–1.2)
Total Protein: 6.8 g/dL (ref 6.5–8.1)

## 2015-11-08 LAB — CBC WITH DIFFERENTIAL/PLATELET
Basophils Absolute: 0 10*3/uL (ref 0.0–0.1)
Basophils Relative: 0 %
Eosinophils Absolute: 0.1 10*3/uL (ref 0.0–0.7)
Eosinophils Relative: 1 %
HCT: 43.5 % (ref 36.0–46.0)
Hemoglobin: 14.6 g/dL (ref 12.0–15.0)
Lymphocytes Relative: 18 %
Lymphs Abs: 1.6 10*3/uL (ref 0.7–4.0)
MCH: 28.9 pg (ref 26.0–34.0)
MCHC: 33.6 g/dL (ref 30.0–36.0)
MCV: 86 fL (ref 78.0–100.0)
Monocytes Absolute: 0.6 10*3/uL (ref 0.1–1.0)
Monocytes Relative: 6 %
Neutro Abs: 6.7 10*3/uL (ref 1.7–7.7)
Neutrophils Relative %: 75 %
Platelets: 133 10*3/uL — ABNORMAL LOW (ref 150–400)
RBC: 5.06 MIL/uL (ref 3.87–5.11)
RDW: 12.4 % (ref 11.5–15.5)
WBC: 9 10*3/uL (ref 4.0–10.5)

## 2015-11-08 LAB — URINALYSIS, ROUTINE W REFLEX MICROSCOPIC
Bilirubin Urine: NEGATIVE
Glucose, UA: NEGATIVE mg/dL
Ketones, ur: NEGATIVE mg/dL
Leukocytes, UA: NEGATIVE
Nitrite: NEGATIVE
Protein, ur: NEGATIVE mg/dL
Specific Gravity, Urine: 1.028 (ref 1.005–1.030)
pH: 5.5 (ref 5.0–8.0)

## 2015-11-08 LAB — URINE MICROSCOPIC-ADD ON

## 2015-11-08 LAB — WET PREP, GENITAL
Clue Cells Wet Prep HPF POC: NONE SEEN
Sperm: NONE SEEN
Trich, Wet Prep: NONE SEEN
Yeast Wet Prep HPF POC: NONE SEEN

## 2015-11-08 LAB — RAPID HIV SCREEN (HIV 1/2 AB+AG)
HIV 1/2 Antibodies: NONREACTIVE
HIV-1 P24 Antigen - HIV24: NONREACTIVE

## 2015-11-08 LAB — POC URINE PREG, ED: Preg Test, Ur: NEGATIVE

## 2015-11-08 MED ORDER — MORPHINE SULFATE (PF) 4 MG/ML IV SOLN
4.0000 mg | Freq: Once | INTRAVENOUS | Status: AC
  Administered 2015-11-08: 4 mg via INTRAVENOUS
  Filled 2015-11-08: qty 1

## 2015-11-08 MED ORDER — ONDANSETRON 4 MG PO TBDP: 4.0000 mg | ORAL_TABLET | Freq: Three times a day (TID) | ORAL | Status: DC | PRN

## 2015-11-08 MED ORDER — MORPHINE SULFATE (PF) 4 MG/ML IV SOLN: 4.0000 mg | Freq: Once | INTRAVENOUS | Status: DC

## 2015-11-08 MED ORDER — SODIUM CHLORIDE 0.9 % IV BOLUS (SEPSIS)
1000.0000 mL | Freq: Once | INTRAVENOUS | Status: DC
Start: 2015-11-08 — End: 2015-11-08

## 2015-11-08 MED ORDER — OXYCODONE-ACETAMINOPHEN 5-325 MG PO TABS: 1.0000 | ORAL_TABLET | ORAL | Status: AC | PRN

## 2015-11-08 MED ORDER — IBUPROFEN 800 MG PO TABS: 800.0000 mg | ORAL_TABLET | Freq: Three times a day (TID) | ORAL | Status: AC

## 2015-11-08 MED ORDER — ONDANSETRON HCL 4 MG/2ML IJ SOLN: 4.0000 mg | Freq: Once | INTRAMUSCULAR | Status: DC | PRN

## 2015-11-08 MED ORDER — SODIUM CHLORIDE 0.9 % IV BOLUS (SEPSIS)
1000.0000 mL | Freq: Once | INTRAVENOUS | Status: AC
  Administered 2015-11-08: 1000 mL via INTRAVENOUS

## 2015-11-08 MED ORDER — ONDANSETRON HCL 4 MG/2ML IJ SOLN
4.0000 mg | Freq: Once | INTRAMUSCULAR | Status: AC
  Administered 2015-11-08: 4 mg via INTRAVENOUS
  Filled 2015-11-08: qty 2

## 2015-11-08 NOTE — ED Notes (Signed)
Pelvic exam set up at bedside.  

## 2015-11-08 NOTE — Discharge Instructions (Signed)
You have been seen today for right lower abdominal pain. Your ultrasound showed evidence of ruptured right ovarian cyst. Your lab showed no abnormalities. Follow up with your OB/GYN this week. Follow up with PCP as needed. Return to ED should symptoms worsen. Take 800 mg of ibuprofen every 8 hours for the next 3 days. Heating pad may also help.   Emergency Department Resource Guide 1) Find a Doctor and Pay Out of Pocket Although you won't have to find out who is covered by your insurance plan, it is a good idea to ask around and get recommendations. You will then need to call the office and see if the doctor you have chosen will accept you as a new patient and what types of options they offer for patients who are self-pay. Some doctors offer discounts or will set up payment plans for their patients who do not have insurance, but you will need to ask so you aren't surprised when you get to your appointment.  2) Contact Your Local Health Department Not all health departments have doctors that can see patients for sick visits, but many do, so it is worth a call to see if yours does. If you don't know where your local health department is, you can check in your phone book. The CDC also has a tool to help you locate your state's health department, and many state websites also have listings of all of their local health departments.  3) Find a Walk-in Clinic If your illness is not likely to be very severe or complicated, you may want to try a walk in clinic. These are popping up all over the country in pharmacies, drugstores, and shopping centers. They're usually staffed by nurse practitioners or physician assistants that have been trained to treat common illnesses and complaints. They're usually fairly quick and inexpensive. However, if you have serious medical issues or chronic medical problems, these are probably not your best option.  No Primary Care Doctor: - Call Health Connect at  [phone] - they can  help you locate a primary care doctor that  accepts your insurance, provides certain services, etc. - Physician Referral Service- [phone]  Chronic Pain Problems: Organization         Address  Phone   Notes  [facility]  [phone] Patients need to be referred by their primary care doctor.   Medication Assistance: Organization         Address  Phone   Notes  [facility] Medication [facility] [address] [place], [place] [address] [phone] --Must be a resident of [facility] -- Must have NO insurance coverage whatsoever (no Medicaid/ Medicare, etc.) -- The pt. MUST have a primary care doctor that directs their care regularly and follows them in the community   [facility]ist  [phone]   [employer]  [phone]    Agencies that provide inexpensive medical care: Organization         Address  Phone   Notes  [facility]  [phone]   [facility]    [phone]   [facility] [address] [place], [place] [address] [phone]   [facility] [address] [place] [place] [phone]   Planned Parenthood    [phone]   [facility]    [phone]   Community Health and [facility]  [address] [place], [employer] Phone:  [phone], Fax:  [phone] Hours of Operation:  9 am - 6 pm, M-F.  Also accepts Medicaid/Medicare and self-pay.  [facility] for [place], [address], [place] Phone: [phone], Fax: [phone]. Hours of Operation:  8:30 am - 5:30 pm, M-F.  Also accepts Medicaid and self-pay.  [facility] [address] [place] Phone: [phone]   [place], [place], [place] [phone], Ext. [phone] Mondays & Thursdays: 7-9 AM.  First 15 patients are seen on a first come, first serve basis.    [place] Providers:  Organization         Address  Phone   Notes  [facility] [address], [place] [phone] Also accepts self-pay patients.  [facility] [id number] [place], [place]  [phone]   [place], [address], [place] [phone]   [facility] [address] [place] [phone]   Lucianne Lei [address], [place]   [phone] Only accepts [place] Access [place] patients after they have their name applied to their card.   Self-Pay (no insurance) in [facility]:  Organization         Address  Phone   Notes  Sickle Cell Patients, [facility] Internal Medicine [place] [phone]   [facility] [place] [phone]   [facility] [place]  [place], [place], [place] [phone]   [facility]/Dr. Osei-Bonsu  [address], [place] or [address], [place] [phone] Phone number for both [place] and [place] locations is the same.  Urgent Medical and [facility] [address] [place] [phone]   [facility] [address] [place] or [address] [phone]   [facility] [address], [place] [phone], phone; [phone], fax Sees patients 1st and 3rd Saturday of every month.  Must not qualify for public or private insurance (i.e. Medicaid, Medicare, [place] Health Choice, Veterans' Benefits)  Household income should be no more than 200% of the poverty level The clinic cannot treat you if you are pregnant or think you are pregnant  Sexually transmitted diseases are not treated at the clinic.    Dental Care: Organization         Address  Phone  Notes  [facility] [place] [phone] Accepts children up to age 31 who are enrolled in [place] or [place]; pregnant women with a Medicaid card; and children who have applied for Medicaid or [place] Health Choice, but were declined, whose parents can pay a reduced fee at time of service.  [facility] of [facility]  [address], [place] [phone] Accepts children up to age 34 who are enrolled in [place] or [place]; pregnant women with a Medicaid card; and children who have applied for Medicaid or [place] Health Choice, but were declined, whose parents can pay a reduced fee at time of service.  [place] Adult [facility] Access PRO[facility]  [place] [phone] Patients are seen by appointment only. Walk-ins  are not accepted. [place] will see patients 33 years of age and older. Monday - Tuesday (8am-5pm) Most Wednesdays (8:30-5pm) $30 per visit, cash only  [facility] Adult Dental Access PROGRAM  [address], [facility] [phone] Patients are seen by appointment only. Walk-ins are not accepted. [place] will see patients 17 years of age and older. One Wednesday Evening (Monthly: Volunteer Based).  $30 per visit, cash only  [place]  [phone] for adults; Children under age 66, call Graduate Pediatric Dentistry at [phone]. Children aged 52-14, please call [phone] to request a pediatric application.  Dental services are provided in all areas of dental care including fillings, crowns and bridges, complete and partial dentures, implants, gum treatment, root canals, and extractions. Preventive care is also provided. Treatment is provided to both adults and children. Patients are selected via a lottery and there is often a waiting list.   [facility] [address] [place]  [phone] [email or web address]   [facility] [address] [place], [place] [phone], Ext. [phone] Second and Fourth Thursday of each month, opens at 6:30 AM; Clinic ends at 9 AM.  Patients are seen on a first-come first-served basis, and a limited number are seen during each clinic.   [facility]  [address] Hillard Danker [place], [place] [phone]   Eligibility Requirements You must have lived in [place], [place], or [place] counties for at least the last three months.   You cannot be eligible for state or federal sponsored [employer], including [employer], [place], or [employer].   You generally cannot be eligible for healthcare insurance through your employer.    How to apply: Eligibility screenings are held every Tuesday and Wednesday afternoon from 1:00 pm until 4:00 pm. You do not need an appointment for the interview!  [facility] [address], [place], [place]   [place]  [place] Department  [place]  [phone]    Behavioral Health Resources in the Community: Intensive Outpatient Programs Organization         Address  Phone  Notes  [place]. [address] [place], [place] [phone]   [facility] Outpatient [address] [place], [place]   ADS: Alcohol & Drug S[facility] [address] [place], [place]   [place] [address],  [place], [place] or [phone]   Substance Abuse Resources Organization         Address  Phone  Notes  Al[facility] and Drug Services  [phone]   [place] [facility]  [phone]   [facility] [place]   Chinita Pester  [phone]   Residential & Outpatient Substance Abuse Program  [phone]   Psychological Services Organization         Address  Phone  Notes  [facility] [place]  [place]  [phone]   [place] [address], [place] [phone] or [phone]    Mobile Crisis Teams Organization         Address  Phone  Notes  [facility], Mobile Crisis Care Unit  [phone]   [facility] Psychotherapeutic [facility]  [address] [place], [place]   [facility] [address], [place] [phone]  Self-Help/Support Groups Organization         Address  Phone             Notes  [facility] [place] - variety of support groups  [place] Call for more information  Narcotics Anonymous (NA), [facility] [address], [employer] [place]  2 meetings at this location   Special educational needs teacher         Address  Phone  Notes  [facility] Residential Treatment [place],    [place]  [phone]   [facility]  [address], [place] [address], [place], [place]   [place], [place] [phone] Admissions: 8am-3pm M-F  Incentives Substance [place] [address],    [employer], [place] [phone]   [facility] [address] [place], [place], [place]   [facility] [address]  [place], [place]   Insight Programs - Intensive Outpatient [address], [place], [place]   [facility] ([place] [place].,  [place], [place] [phone] or [phone]   [facility] Services (RTS) [address], [place], [place] Accepts Medicaid  [facility] [place] [address],  [place] [phone] Substance Abuse/Addiction Treatment   [facility] Organization         Address  Phone  Notes  [facility]  [phone]   Domenic Schwab, PhD [address] [place] [place], [place]   [phone] or  [phone]   [place], [place] [phone]   [facility] [address], [place], [place] [phone] Insurance/Medicaid/sponsorship through [facility] and Families [address] [place]                                    [place], [place] [phone] [place] [address], [place] [phone]    Dr. Adele Schilder  [phone]   [facility] Dept. 1) [address], [place] 2) [place] 3)  [place] [address], [place] [phone]  [phone]   [place] [phone] or [phone] (After Hours)

## 2015-11-08 NOTE — ED Notes (Signed)
Pt transported to US

## 2015-11-08 NOTE — ED Notes (Signed)
Patient transported to Ultrasound 

## 2015-11-08 NOTE — ED Notes (Signed)
Pt complains of right lower quad pain, known ovarian cyst

## 2015-11-08 NOTE — ED Notes (Signed)
PA at bedside.

## 2015-11-08 NOTE — ED Provider Notes (Signed)
CSN: [id number]     Arrival date & time 11/08/15  0435 History   First MD Initiated Contact with Patient 11/08/15 0559     Chief Complaint  Patient presents with  . Ovarian Cyst     (Consider location/radiation/quality/duration/timing/severity/associated sxs/prior Treatment) HPI   Megan Larsen is a 20 y.o. female, with a history of diagnosed right ovarian cyst, presenting to the ED with lower right abdominal pain that came on last night. Pt states she has a known ovarian cyst on her right ovary. Pt has tried ibuprofen with no relief. Pt rates her pain at 8/10, cramping/sharp in nature, nonradiating. Pt endorses nausea as well. Pt denies vomiting/constipation/diarrhea, fever/chills, vaginal discharge, urinary symptoms, or any other complaints. LMP was about two months ago, but pt states she doesn't have regular periods anymore due to her IUD.     Past Medical History  Diagnosis Date  . Hypotension   . Intestinal infection due to bacteria   . GERD (gastroesophageal reflux disease)   . Cellulitis of arm, left     surgically debreeded and packed.   Past Surgical History  Procedure Laterality Date  . Left arm      cellulitis   Family History  Problem Relation Age of Onset  . Cancer Mother   . Diabetes Mother   . Hyperlipidemia Father    Social History  Substance Use Topics  . Smoking status: Never Smoker   . Smokeless tobacco: None  . Alcohol Use: No   OB History    Gravida Para Term Preterm AB TAB SAB Ectopic Multiple Living   2              Review of Systems  Constitutional: Negative for fever, chills and diaphoresis.  Respiratory: Negative for shortness of breath.   Cardiovascular: Negative for chest pain.  Gastrointestinal: Positive for nausea and abdominal pain. Negative for vomiting, diarrhea, constipation and blood in stool.  Genitourinary: Positive for pelvic pain. Negative for dysuria, hematuria, vaginal bleeding, vaginal discharge and vaginal pain.  Skin:  Negative for color change and pallor.  Neurological: Negative for dizziness and light-headedness.  All other systems reviewed and are negative.     Allergies  Evekeo  Home Medications   Prior to Admission medications   Medication Sig Start Date End Date Taking? Authorizing Provider  ibuprofen (ADVIL,MOTRIN) 200 MG tablet Take 400 mg by mouth every 6 (six) hours as needed (for pain.).   Yes Historical Provider, MD  ibuprofen (ADVIL,MOTRIN) 800 MG tablet Take 1 tablet (800 mg total) by mouth 3 (three) times daily. 11/08/15   Anselm Pancoast, PA-C  levonorgestrel (MIRENA) 20 MCG/24HR IUD 1 each by Intrauterine route once.    Historical Provider, MD  ondansetron (ZOFRAN ODT) 4 MG disintegrating tablet Take 1 tablet (4 mg total) by mouth every 8 (eight) hours as needed for nausea or vomiting. 11/08/15   Shawn C Joy, PA-C  oxyCODONE-acetaminophen (PERCOCET/ROXICET) 5-325 MG tablet Take 1-2 tablets by mouth every 4 (four) hours as needed for severe pain. 11/08/15   Shawn C Joy, PA-C   BP 102/67 mmHg  Pulse 44  Temp(Src) 97.5 F (36.4 C) (Oral)  Resp 20  SpO2 99% Physical Exam  Constitutional: She appears well-developed and well-nourished. No distress.  HENT:  Head: Normocephalic and atraumatic.  Eyes: Conjunctivae are normal. Pupils are equal, round, and reactive to light.  Neck: Neck supple.  Cardiovascular: Normal rate, regular rhythm, normal heart sounds and intact distal pulses.   Pulmonary/Chest: Effort normal  and breath sounds normal. No respiratory distress.  Abdominal: Soft. Normal appearance and bowel sounds are normal. There is tenderness in the right lower quadrant. There is no guarding and no CVA tenderness.  Genitourinary:  External genitalia normal Vagina with discharge - scant, white discharge Cervix  normal negative for cervical motion tenderness Adnexa palpated, no masses or positive for tenderness noted - right adnexal tenderness Bladder palpated negative for  tenderness Uterus palpated no masses or negative for tenderness Otherwise normal female genitalia. Tech, Clydie Braun, served as Biomedical engineer during exam.  Musculoskeletal: She exhibits no edema or tenderness.  Lymphadenopathy:    She has no cervical adenopathy.  Neurological: She is alert.  Skin: Skin is warm and dry. She is not diaphoretic.  Nursing note and vitals reviewed.   ED Course  Pelvic exam Date/Time: 11/08/2015 7:30 AM Performed by: Anselm Pancoast Authorized by: Anselm Pancoast Consent: Verbal consent obtained. Risks and benefits: risks, benefits and alternatives were discussed Consent given by: patient Patient understanding: patient states understanding of the procedure being performed Patient consent: the patient's understanding of the procedure matches consent given Procedure consent: procedure consent matches procedure scheduled Patient identity confirmed: verbally with patient and arm band Local anesthesia used: no Patient sedated: no Patient tolerance: Patient tolerated the procedure well with no immediate complications   (including critical care time) Labs Review Labs Reviewed  URINALYSIS, ROUTINE W REFLEX MICROSCOPIC (NOT AT [facility]) - Abnormal; Notable for the following:    Hgb urine dipstick SMALL (*)    All other components within normal limits  CBC WITH DIFFERENTIAL/PLATELET - Abnormal; Notable for the following:    Platelets 133 (*)    All other components within normal limits  COMPREHENSIVE METABOLIC PANEL - Abnormal; Notable for the following:    Glucose, Bld 105 (*)    ALT 13 (*)    All other components within normal limits  URINE MICROSCOPIC-ADD ON - Abnormal; Notable for the following:    Squamous Epithelial / LPF 6-30 (*)    Bacteria, UA FEW (*)    All other components within normal limits  WET PREP, GENITAL  RAPID HIV SCREEN (HIV 1/2 AB+AG)  POC URINE PREG, ED  GC/CHLAMYDIA PROBE AMP ([place]) NOT AT [facility]    Imaging Review US Transvaginal  Non-ob  11/08/2015  CLINICAL DATA:  Abdominal and pelvic pain. EXAM: TRANSABDOMINAL AND TRANSVAGINAL ULTRASOUND OF PELVIS DOPPLER ULTRASOUND OF OVARIES TECHNIQUE: Both transabdominal and transvaginal ultrasound examinations of the pelvis were performed. Transabdominal technique was performed for global imaging of the pelvis including uterus, ovaries, adnexal regions, and pelvic cul-de-sac. It was necessary to proceed with endovaginal exam following the transabdominal exam to visualize the uterus and ovaries. Color and duplex Doppler ultrasound was utilized to evaluate blood flow to the ovaries. COMPARISON:  05/24/2015. FINDINGS: Uterus Measurements: 7.6 x 3.2 x 5.1 cm. No fibroids or other mass visualized. Endometrium Thickness: 2.8 mm. No focal abnormality visualized. IUD again noted in stable position . Right ovary Measurements: 4.7 x 2.2 x 2.5 cm. Normal appearance/no adnexal mass. Multiple small cysts consistent with follicles . Pregnancy test suggested to exclude ectopic pregnancy. Interim resolution of previously identified 5.1 cm right ovarian cyst. Left ovary Measurements: 4.2 x 2.1 x 3.1 cm. Normal appearance/no adnexal mass. Multiple small cysts consistent with follicles. Pulsed Doppler evaluation of both ovaries demonstrates normal low-resistance arterial and venous waveforms. Other findings Trace free pelvic fluid . IMPRESSION: 1.  No evidence of ovarian torsion. 2.  IUD noted in stable position.  3. Multiple small cysts throughout both ovaries consistent with follicles. Interim resolution of 5.1 cm cyst right ovary. 4. Trace free pelvic fluid . Electronically Signed   By: Maisie Fus  Register   On: 11/08/2015 07:37   US Pelvis Complete  11/08/2015  CLINICAL DATA:  Abdominal and pelvic pain. EXAM: TRANSABDOMINAL AND TRANSVAGINAL ULTRASOUND OF PELVIS DOPPLER ULTRASOUND OF OVARIES TECHNIQUE: Both transabdominal and transvaginal ultrasound examinations of the pelvis were performed. Transabdominal technique was  performed for global imaging of the pelvis including uterus, ovaries, adnexal regions, and pelvic cul-de-sac. It was necessary to proceed with endovaginal exam following the transabdominal exam to visualize the uterus and ovaries. Color and duplex Doppler ultrasound was utilized to evaluate blood flow to the ovaries. COMPARISON:  05/24/2015. FINDINGS: Uterus Measurements: 7.6 x 3.2 x 5.1 cm. No fibroids or other mass visualized. Endometrium Thickness: 2.8 mm. No focal abnormality visualized. IUD again noted in stable position . Right ovary Measurements: 4.7 x 2.2 x 2.5 cm. Normal appearance/no adnexal mass. Multiple small cysts consistent with follicles . Pregnancy test suggested to exclude ectopic pregnancy. Interim resolution of previously identified 5.1 cm right ovarian cyst. Left ovary Measurements: 4.2 x 2.1 x 3.1 cm. Normal appearance/no adnexal mass. Multiple small cysts consistent with follicles. Pulsed Doppler evaluation of both ovaries demonstrates normal low-resistance arterial and venous waveforms. Other findings Trace free pelvic fluid . IMPRESSION: 1.  No evidence of ovarian torsion. 2.  IUD noted in stable position. 3. Multiple small cysts throughout both ovaries consistent with follicles. Interim resolution of 5.1 cm cyst right ovary. 4. Trace free pelvic fluid . Electronically Signed   By: Maisie Fus  Register   On: 11/08/2015 07:37   [place] Art/ven Flow Abd Pelv Doppler  11/08/2015  CLINICAL DATA:  Abdominal and pelvic pain. EXAM: TRANSABDOMINAL AND TRANSVAGINAL ULTRASOUND OF PELVIS DOPPLER ULTRASOUND OF OVARIES TECHNIQUE: Both transabdominal and transvaginal ultrasound examinations of the pelvis were performed. Transabdominal technique was performed for global imaging of the pelvis including uterus, ovaries, adnexal regions, and pelvic cul-de-sac. It was necessary to proceed with endovaginal exam following the transabdominal exam to visualize the uterus and ovaries. Color and duplex Doppler ultrasound  was utilized to evaluate blood flow to the ovaries. COMPARISON:  05/24/2015. FINDINGS: Uterus Measurements: 7.6 x 3.2 x 5.1 cm. No fibroids or other mass visualized. Endometrium Thickness: 2.8 mm. No focal abnormality visualized. IUD again noted in stable position . Right ovary Measurements: 4.7 x 2.2 x 2.5 cm. Normal appearance/no adnexal mass. Multiple small cysts consistent with follicles . Pregnancy test suggested to exclude ectopic pregnancy. Interim resolution of previously identified 5.1 cm right ovarian cyst. Left ovary Measurements: 4.2 x 2.1 x 3.1 cm. Normal appearance/no adnexal mass. Multiple small cysts consistent with follicles. Pulsed Doppler evaluation of both ovaries demonstrates normal low-resistance arterial and venous waveforms. Other findings Trace free pelvic fluid . IMPRESSION: 1.  No evidence of ovarian torsion. 2.  IUD noted in stable position. 3. Multiple small cysts throughout both ovaries consistent with follicles. Interim resolution of 5.1 cm cyst right ovary. 4. Trace free pelvic fluid . Electronically Signed   By: Maisie Fus  Register   On: 11/08/2015 07:37   I have personally reviewed and evaluated these images and lab results as part of my medical decision-making.   EKG Interpretation None      MDM   Final diagnoses:  Right lower quadrant abdominal pain  Pelvic pain in female  Nausea  Ruptured ovarian cyst    Tamsen Snider presents with right lower  pelvic pain that began last night.  Patient is nontoxic appearing, afebrile, not tachycardic, not tachypneic, maintains SPO2 of 99-100% on room air, and is in no apparent distress. Patient has no signs of sepsis or other serious or life-threatening condition. It is likely that the source of this patient's pain is indeed her ovarian cyst. 7:29 AM Upon reassessment, patient states that her pain has decreased down to about a 5 out of 10 and her nausea is well controlled. Additional pain medicine ordered. Ultrasound reveals  trace free pelvic fluid and partial resolution of the known right ovarian cyst, most likely indicating a rupture of the cyst, which would be a logical source for the patient's discomfort. No evidence of ovarian torsion.  Filed Vitals:   11/08/15 0445 11/08/15 0600  BP: 112/83 102/67  Pulse: 52 44  Temp: 97.5 F (36.4 C)   TempSrc: Oral   Resp: 20   SpO2: 100% 99%     Anselm Pancoast, PA-C 11/08/15 0755  Layla Maw Ward, DO 11/09/15 2956

## 2015-11-09 LAB — GC/CHLAMYDIA PROBE AMP (~~LOC~~) NOT AT ARMC
Chlamydia: NEGATIVE
Neisseria Gonorrhea: NEGATIVE

## 2016-04-04 IMAGING — US US TRANSVAGINAL NON-OB
1 series · 13 of 25 positions shown · non-contrast
Comparison: 05/24/2015.

CLINICAL DATA: Abdominal and pelvic pain.

EXAM:
TRANSABDOMINAL AND TRANSVAGINAL ULTRASOUND OF PELVIS
DOPPLER ULTRASOUND OF OVARIES
TECHNIQUE: Both transabdominal and transvaginal ultrasound examinations of the
pelvis were performed. Transabdominal technique was performed for
global imaging of the pelvis including uterus, ovaries, adnexal
regions, and pelvic cul-de-sac.
It was necessary to proceed with endovaginal exam following the
transabdominal exam to visualize the uterus and ovaries.. Color and
duplex Doppler ultrasound was utilized to evaluate blood flow to the
ovaries.

[Series 1: us transvaginal non-ob · 0.20mm/px · 13 of 84 slices shown]
[im 1/84]
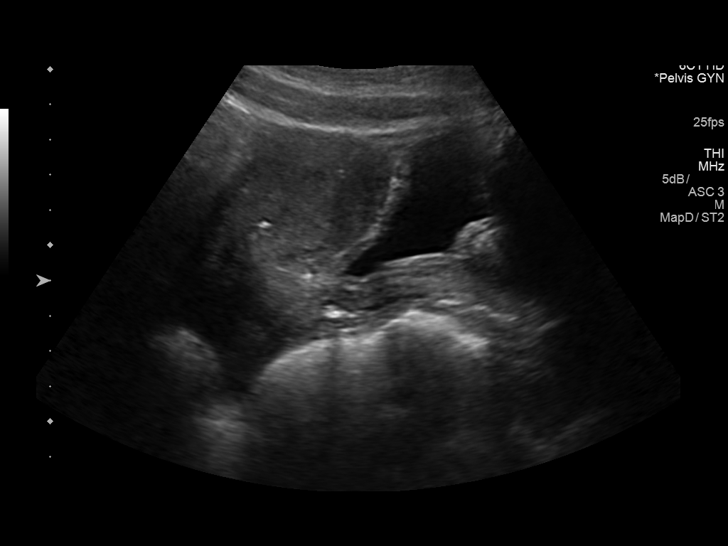
[im 7/84]
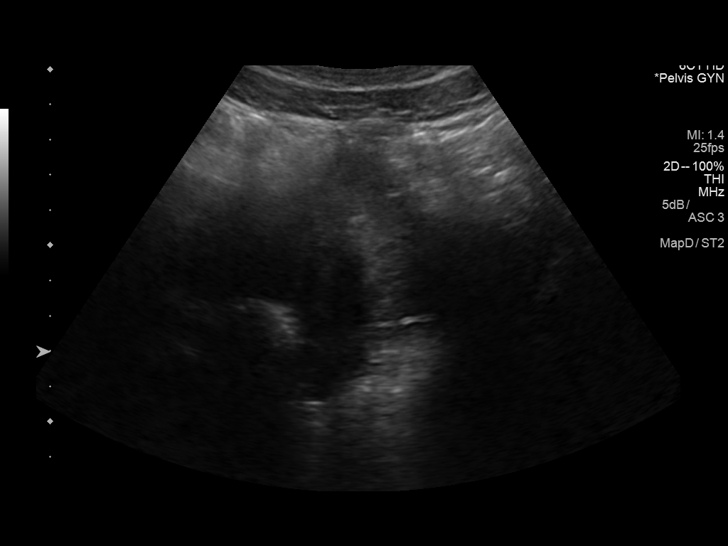
[im 14/84]
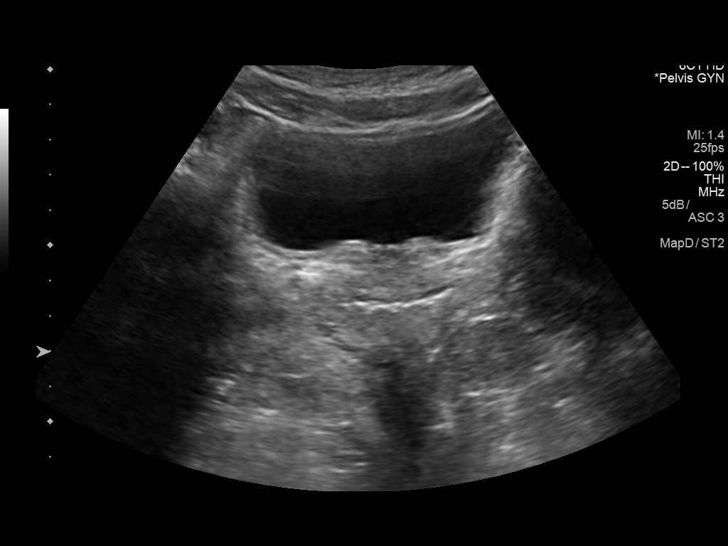
[im 21/84]
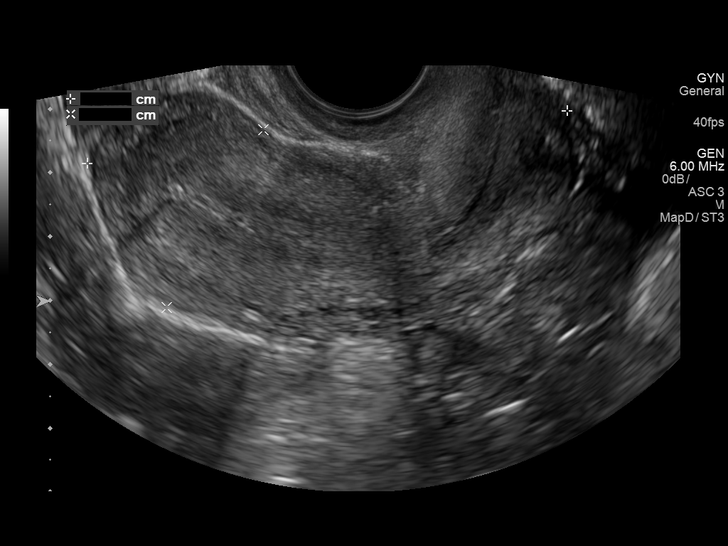
[im 28/84]
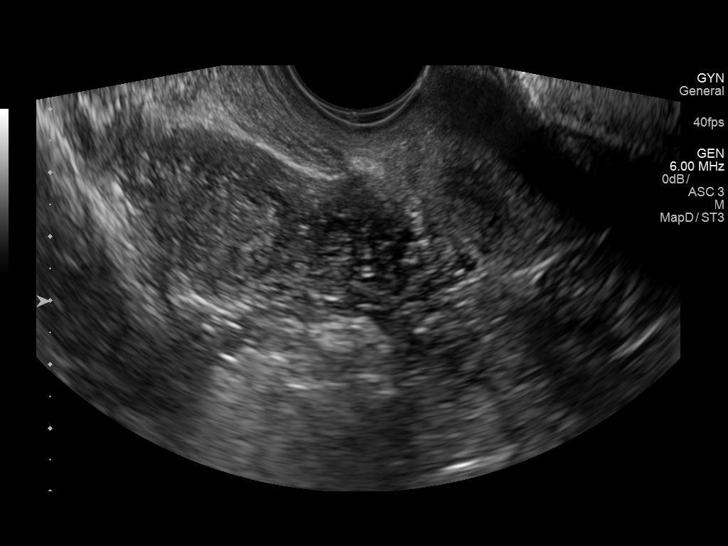
[im 35/84]
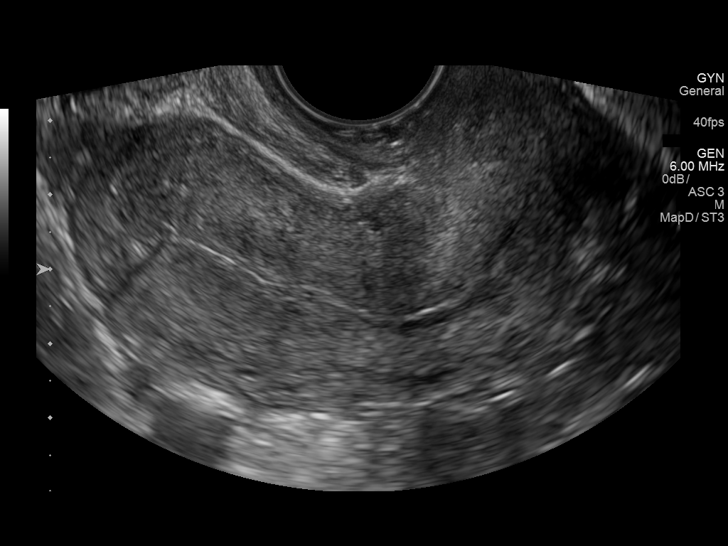
[im 42/84]
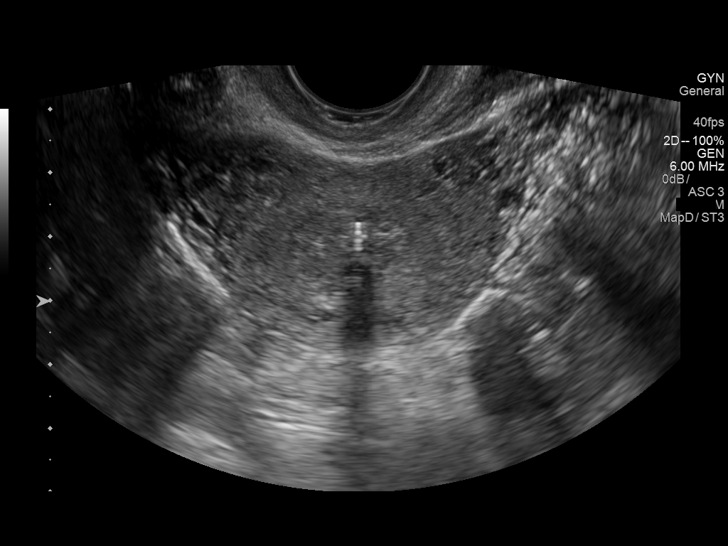
[im 49/84]
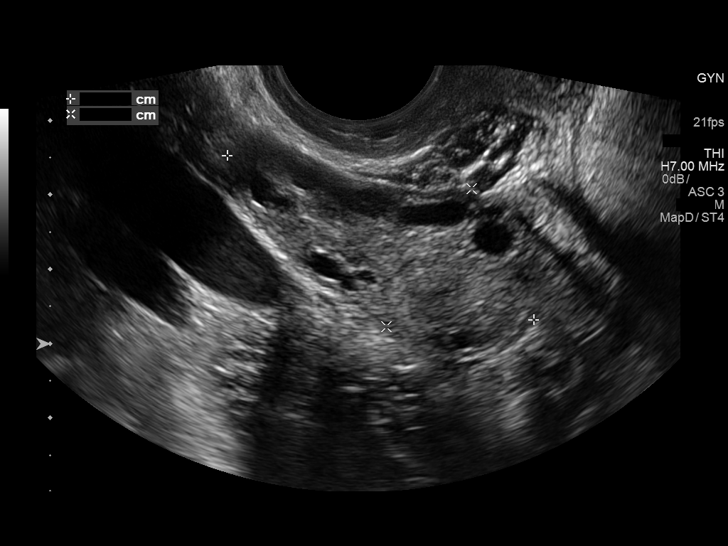
[im 56/84]
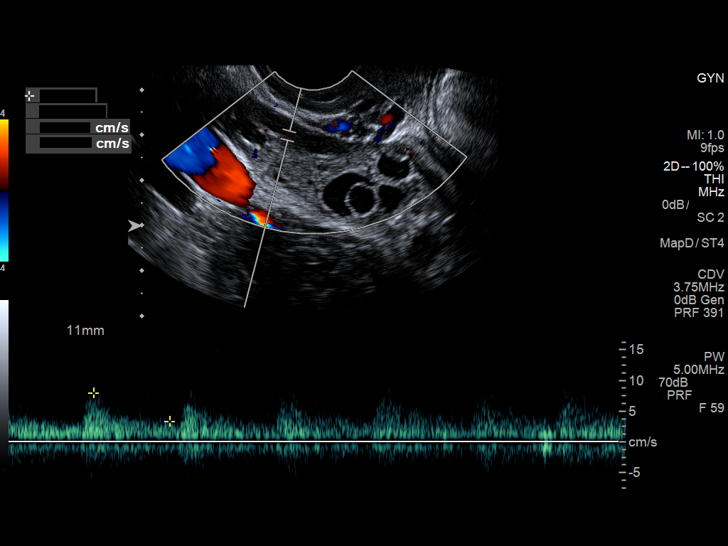
[im 63/84]
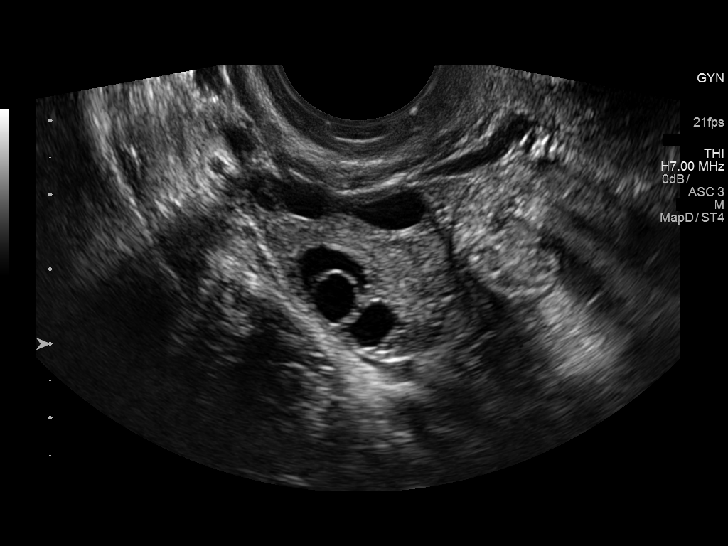
[im 70/84]
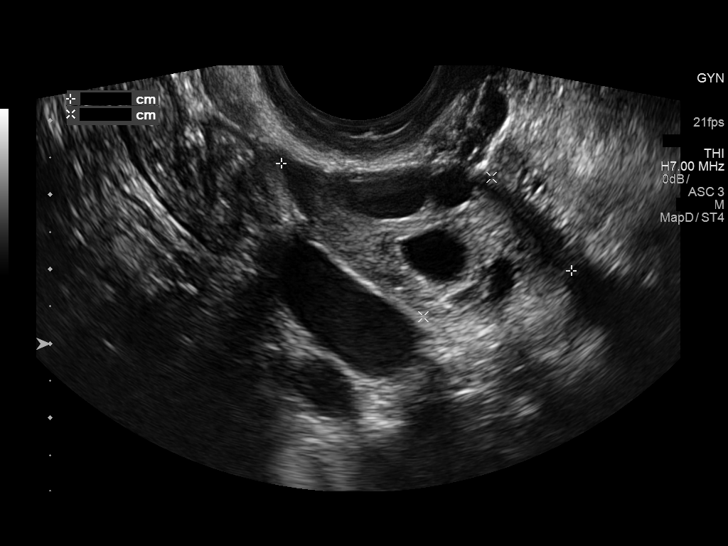
[im 77/84]
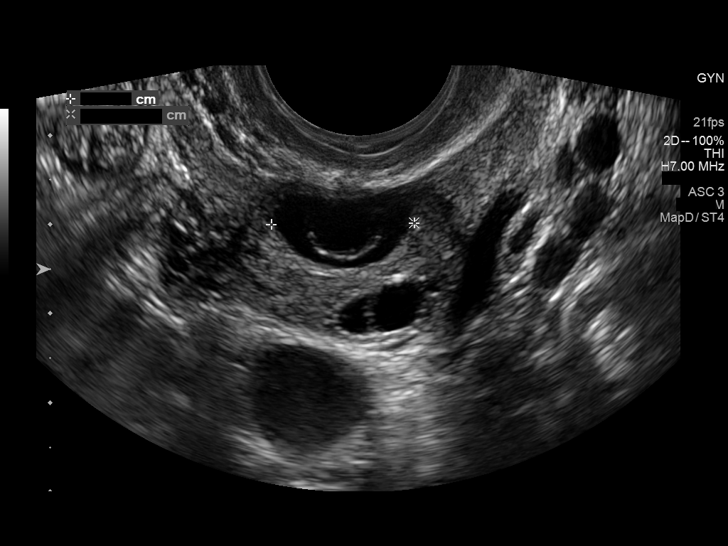
[im 84/84]
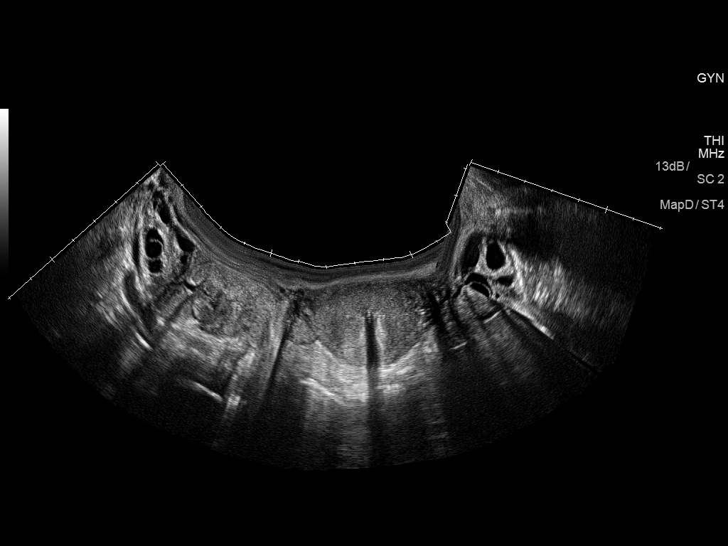

[13 of 25 positions shown; findings below may reference images not displayed]

FINDINGS: Uterus

Measurements: 7.6 x 3.2 x 5.1 cm. No fibroids or other mass
visualized.

Endometrium

Thickness: 2.8 mm.. No focal abnormality visualized. IUD again noted
in stable position .

Right ovary

Measurements: 4.7 x 2.2 x 2.5 cm. Normal appearance/no adnexal mass.
Multiple small cysts consistent with follicles . Pregnancy test
suggested to exclude ectopic pregnancy. Interim resolution of
previously identified 5.1 cm right ovarian cyst.

Left ovary

Measurements: 4.2 x 2.1 x 3.1 cm. Normal appearance/no adnexal mass.
Multiple small cysts consistent with follicles.

Pulsed Doppler evaluation of both ovaries demonstrates normal
low-resistance arterial and venous waveforms.

Other findings

Trace free pelvic fluid .
IMPRESSION: 1.  No evidence of ovarian torsion.

2.  IUD noted in stable position.

3. Multiple small cysts throughout both ovaries consistent with
follicles. Interim resolution of 5.1 cm cyst right ovary.

4. Trace free pelvic fluid .

## 2016-04-20 ENCOUNTER — Emergency Department (HOSPITAL_COMMUNITY)
Admission: EM | Admit: 2016-04-20 | Discharge: 2016-04-20 | Disposition: A | Discharge status: Home / Self Care | Payer: BLUE CROSS/BLUE SHIELD | Attending: Emergency Medicine | Admitting: Emergency Medicine

## 2016-04-20 ENCOUNTER — Encounter (HOSPITAL_COMMUNITY): Payer: Self-pay | Admitting: Emergency Medicine

## 2016-04-20 DIAGNOSIS — Z79899 Other long term (current) drug therapy: Secondary | ICD-10-CM | POA: Insufficient documentation

## 2016-04-20 DIAGNOSIS — R55 Syncope and collapse: Secondary | ICD-10-CM

## 2016-04-20 DIAGNOSIS — R112 Nausea with vomiting, unspecified: Secondary | ICD-10-CM

## 2016-04-20 DIAGNOSIS — IMO0001 Reserved for inherently not codable concepts without codable children: Secondary | ICD-10-CM

## 2016-04-20 DIAGNOSIS — N926 Irregular menstruation, unspecified: Secondary | ICD-10-CM | POA: Insufficient documentation

## 2016-04-20 DIAGNOSIS — Z791 Long term (current) use of non-steroidal anti-inflammatories (NSAID): Secondary | ICD-10-CM | POA: Diagnosis not present

## 2016-04-20 DIAGNOSIS — T8383XA Hemorrhage of genitourinary prosthetic devices, implants and grafts, initial encounter: Secondary | ICD-10-CM

## 2016-04-20 DIAGNOSIS — N923 Ovulation bleeding: Secondary | ICD-10-CM

## 2016-04-20 LAB — CBC WITH DIFFERENTIAL/PLATELET
Basophils Absolute: 0 10*3/uL (ref 0.0–0.1)
Basophils Relative: 0 %
Eosinophils Absolute: 0 10*3/uL (ref 0.0–0.7)
Eosinophils Relative: 0 %
HCT: 39.2 % (ref 36.0–46.0)
Hemoglobin: 13.3 g/dL (ref 12.0–15.0)
Lymphocytes Relative: 19 %
Lymphs Abs: 1.1 10*3/uL (ref 0.7–4.0)
MCH: 28.8 pg (ref 26.0–34.0)
MCHC: 33.9 g/dL (ref 30.0–36.0)
MCV: 84.8 fL (ref 78.0–100.0)
Monocytes Absolute: 0.5 10*3/uL (ref 0.1–1.0)
Monocytes Relative: 8 %
Neutro Abs: 4.3 10*3/uL (ref 1.7–7.7)
Neutrophils Relative %: 73 %
Platelets: 122 10*3/uL — ABNORMAL LOW (ref 150–400)
RBC: 4.62 MIL/uL (ref 3.87–5.11)
RDW: 12.7 % (ref 11.5–15.5)
WBC: 5.9 10*3/uL (ref 4.0–10.5)

## 2016-04-20 LAB — BASIC METABOLIC PANEL
Anion gap: 6 (ref 5–15)
BUN: 13 mg/dL (ref 6–20)
CO2: 23 mmol/L (ref 22–32)
Calcium: 9.2 mg/dL (ref 8.9–10.3)
Chloride: 109 mmol/L (ref 101–111)
Creatinine, Ser: 0.9 mg/dL (ref 0.44–1.00)
GFR calc Af Amer: >60 mL/min (ref >60)
GFR calc non Af Amer: >60 mL/min (ref >60)
Glucose, Bld: 100 mg/dL — ABNORMAL HIGH (ref 65–99)
Potassium: 3.7 mmol/L (ref 3.5–5.1)
Sodium: 138 mmol/L (ref 135–145)

## 2016-04-20 LAB — POC URINE PREG, ED: Preg Test, Ur: NEGATIVE

## 2016-04-20 MED ORDER — SODIUM CHLORIDE 0.9 % IV BOLUS (SEPSIS)
1000.0000 mL | Freq: Once | INTRAVENOUS | Status: AC
  Administered 2016-04-20: 1000 mL via INTRAVENOUS

## 2016-04-20 MED ORDER — ONDANSETRON HCL 4 MG/2ML IJ SOLN
4.0000 mg | Freq: Once | INTRAMUSCULAR | Status: AC
  Administered 2016-04-20: 4 mg via INTRAVENOUS
  Filled 2016-04-20: qty 2

## 2016-04-20 MED ORDER — ONDANSETRON HCL 4 MG PO TABS: 4.0000 mg | ORAL_TABLET | Freq: Three times a day (TID) | ORAL | Status: DC | PRN

## 2016-04-20 NOTE — ED Provider Notes (Signed)
CSN: [id number]     Arrival date & time 04/20/16  8295 History   First MD Initiated Contact with Patient 04/20/16 1007     Chief Complaint  Patient presents with  . Emesis     (Consider location/radiation/quality/duration/timing/severity/associated sxs/prior Treatment) HPI  20 year old female who presents with dry heaving and syncopal episode. History of low blood pressures, ovarian cysts, and GERD.Reports that she has been having vaginal spotting since April 2017 and in the interim has had double shot with IUD placed at the end of June 2017. States that she continued to have vaginal spotting, which bothered her, and subsequently was started on oral OCPs for one week. She has continued to have some vaginal spotting, but this morning woke up with nausea and dry heaving. She has been very shaky this morning. Then felt very lightheaded, and her vision closing in, and had syncopal epsidoes. No chest pain or dyspnea. Has had intermittent uterine cramping since IUD but denies any abdominal pain. No abnormal vaginal discharge. No fever, chills, diarrhea, dysuria, urinary frequency.   Past Medical History  Diagnosis Date  . Hypotension   . Intestinal infection due to bacteria   . GERD (gastroesophageal reflux disease)   . Cellulitis of arm, left     surgically debreeded and packed.   Past Surgical History  Procedure Laterality Date  . Left arm      cellulitis   Family History  Problem Relation Age of Onset  . Cancer Mother   . Diabetes Mother   . Hyperlipidemia Father    Social History  Substance Use Topics  . Smoking status: Never Smoker   . Smokeless tobacco: None  . Alcohol Use: No   OB History    Gravida Para Term Preterm AB TAB SAB Ectopic Multiple Living   2              Review of Systems 10/14 systems reviewed and are negative other than those stated in the HPI    Allergies  Evekeo  Home Medications   Prior to Admission medications   Medication Sig Start Date End  Date Taking? Authorizing Provider  ibuprofen (ADVIL,MOTRIN) 200 MG tablet Take 400 mg by mouth every 6 (six) hours as needed (for pain.).   Yes Historical Provider, MD  levonorgestrel (MIRENA) 20 MCG/24HR IUD 1 each by Intrauterine route once.   Yes Historical Provider, MD  Norethin Ace-Eth Estrad-FE (TAYTULLA) 1-20 MG-MCG(24) CAPS Take 1 tablet by mouth daily.   Yes Historical Provider, MD  ibuprofen (ADVIL,MOTRIN) 800 MG tablet Take 1 tablet (800 mg total) by mouth 3 (three) times daily. Patient not taking: Reported on 04/20/2016 11/08/15   Shawn C Joy, PA-C  ondansetron (ZOFRAN ODT) 4 MG disintegrating tablet Take 1 tablet (4 mg total) by mouth every 8 (eight) hours as needed for nausea or vomiting. Patient not taking: Reported on 04/20/2016 11/08/15   Shawn C Joy, PA-C  ondansetron (ZOFRAN) 4 MG tablet Take 1 tablet (4 mg total) by mouth every 8 (eight) hours as needed for nausea or vomiting. 04/20/16   Lavera Guise, MD  oxyCODONE-acetaminophen (PERCOCET/ROXICET) 5-325 MG tablet Take 1-2 tablets by mouth every 4 (four) hours as needed for severe pain. Patient not taking: Reported on 04/20/2016 11/08/15   Shawn C Joy, PA-C   BP 104/61 mmHg  Pulse 52  Temp(Src) 98.7 F (37.1 C) (Oral)  Resp 16  SpO2 100% Physical Exam Physical Exam  Nursing note and vitals reviewed. Constitutional: Well developed, well  nourished, non-toxic, and in no acute distress Head: Normocephalic and atraumatic.  Mouth/Throat: Oropharynx is clear and dry mucous membranes.  Neck: Normal range of motion. Neck supple.  Cardiovascular: Normal rate and regular rhythm.   Pulmonary/Chest: Effort normal and breath sounds normal.  Abdominal: Soft. There is no tenderness. There is no rebound and no guarding.  Musculoskeletal: Normal range of motion.  Neurological: Alert, no facial droop, fluent speech, moves all extremities symmetrically Skin: Skin is warm and dry.  Psychiatric: Cooperative  ED Course  Procedures (including  critical care time) Labs Review Labs Reviewed  CBC WITH DIFFERENTIAL/PLATELET - Abnormal; Notable for the following:    Platelets 122 (*)    All other components within normal limits  BASIC METABOLIC PANEL - Abnormal; Notable for the following:    Glucose, Bld 100 (*)    All other components within normal limits  POC URINE PREG, ED    Imaging Review No results found. I have personally reviewed and evaluated these images and lab results as part of my medical decision-making.   EKG Interpretation   Date/Time:  Friday April 20 2016 11:01:48 EDT Ventricular Rate:  48 PR Interval:    QRS Duration: 97 QT Interval:  458 QTC Calculation: 410 R Axis:   109 Text Interpretation:  Sinus bradycardia Borderline right axis deviation  Similar to prior EKG  Confirmed by Zamiya Dillard MD, Annabelle HarmanANA ([id number]) on 04/20/2016  11:14:45 AM      MDM   Final diagnoses:  Non-intractable vomiting with nausea, vomiting of unspecified type  Intermenstrual spotting due to IUD, initial encounter [facility](HCC)  Vasovagal syncope    20 year old female Who presents with syncope in the setting of dry heaving. Seems vasovagal in nature. Her vital signs are within normal limits on presentation and she is well-appearing and in no acute distress. Her abdomen is soft and benign without any pelvic tenderness. Blood work including CBC and basic metabolic panel are unremarkable. She has had intermittent vaginal spotting now ongoing for several months and has had multiple changes in birth control, and I do not think that this is atypical in the setting of her recent IUD placement and now just starting OCPs as well. Her EKG does not show ischemia or stigmata of arrhythmia. She is given IV fluids and antibiotics and feels improved. She will follow-up closely with her gynecologist.  Strict return and follow-up instructions reviewed. She/He expressed understanding of all discharge instructions and felt comfortable with the plan of care.   Lavera Guiseana Duo  Gloriajean Okun, MD 04/20/16 1600

## 2016-04-20 NOTE — Discharge Instructions (Signed)
Continue to follow-up with your gynecologist. Return without fail for worsening symptoms, including escalating pain, intractable vomiting, fever, or any other symptoms concerning to you.  Nausea and Vomiting Nausea is a sick feeling that often comes before throwing up (vomiting). Vomiting is a reflex where stomach contents come out of your mouth. Vomiting can cause severe loss of body fluids (dehydration). Children and elderly adults can become dehydrated quickly, especially if they also have diarrhea. Nausea and vomiting are symptoms of a condition or disease. It is important to find the cause of your symptoms. CAUSES   Direct irritation of the stomach lining. This irritation can result from increased acid production (gastroesophageal reflux disease), infection, food poisoning, taking certain medicines (such as nonsteroidal anti-inflammatory drugs), alcohol use, or tobacco use.  Signals from the brain.These signals could be caused by a headache, heat exposure, an inner ear disturbance, increased pressure in the brain from injury, infection, a tumor, or a concussion, pain, emotional stimulus, or metabolic problems.  An obstruction in the gastrointestinal tract (bowel obstruction).  Illnesses such as diabetes, hepatitis, gallbladder problems, appendicitis, kidney problems, cancer, sepsis, atypical symptoms of a heart attack, or eating disorders.  Medical treatments such as chemotherapy and radiation.  Receiving medicine that makes you sleep (general anesthetic) during surgery. DIAGNOSIS Your caregiver may ask for tests to be done if the problems do not improve after a few days. Tests may also be done if symptoms are severe or if the reason for the nausea and vomiting is not clear. Tests may include:  Urine tests.  Blood tests.  Stool tests.  Cultures (to look for evidence of infection).  X-rays or other imaging studies. Test results can help your caregiver make decisions about treatment  or the need for additional tests. TREATMENT You need to stay well hydrated. Drink frequently but in small amounts.You may wish to drink water, sports drinks, clear broth, or eat frozen ice pops or gelatin dessert to help stay hydrated.When you eat, eating slowly may help prevent nausea.There are also some antinausea medicines that may help prevent nausea. HOME CARE INSTRUCTIONS   Take all medicine as directed by your caregiver.  If you do not have an appetite, do not force yourself to eat. However, you must continue to drink fluids.  If you have an appetite, eat a normal diet unless your caregiver tells you differently.  Eat a variety of complex carbohydrates (rice, wheat, potatoes, bread), lean meats, yogurt, fruits, and vegetables.  Avoid high-fat foods because they are more difficult to digest.  Drink enough water and fluids to keep your urine clear or pale yellow.  If you are dehydrated, ask your caregiver for specific rehydration instructions. Signs of dehydration may include:  Severe thirst.  Dry lips and mouth.  Dizziness.  Dark urine.  Decreasing urine frequency and amount.  Confusion.  Rapid breathing or pulse. SEEK IMMEDIATE MEDICAL CARE IF:   You have blood or brown flecks (like coffee grounds) in your vomit.  You have black or bloody stools.  You have a severe headache or stiff neck.  You are confused.  You have severe abdominal pain.  You have chest pain or trouble breathing.  You do not urinate at least once every 8 hours.  You develop cold or clammy skin.  You continue to vomit for longer than 24 to 48 hours.  You have a fever. MAKE SURE YOU:   Understand these instructions.  Will watch your condition.  Will get help right away if you are  not doing well or get worse.   This information is not intended to replace advice given to you by your health care provider. Make sure you discuss any questions you have with your health care provider.    Document Released: 09/17/2005 Document Revised: 12/10/2011 Document Reviewed: 02/14/2011 Elsevier Interactive Patient Education 2016 [employer]  Abnormal Uterine Bleeding Abnormal uterine bleeding can affect women at various stages in life, including teenagers, women in their reproductive years, pregnant women, and women who have reached menopause. Several kinds of uterine bleeding are considered abnormal, including:  Bleeding or spotting between periods.   Bleeding after sexual intercourse.   Bleeding that is heavier or more than normal.   Periods that last longer than usual.  Bleeding after menopause.  Many cases of abnormal uterine bleeding are minor and simple to treat, while others are more serious. Any type of abnormal bleeding should be evaluated by your health care provider. Treatment will depend on the cause of the bleeding. HOME CARE INSTRUCTIONS Monitor your condition for any changes. The following actions may help to alleviate any discomfort you are experiencing:  Avoid the use of tampons and douches as directed by your health care provider.  Change your pads frequently. You should get regular pelvic exams and Pap tests. Keep all follow-up appointments for diagnostic tests as directed by your health care provider.  SEEK MEDICAL CARE IF:   Your bleeding lasts more than 1 week.   You feel dizzy at times.  SEEK IMMEDIATE MEDICAL CARE IF:   You pass out.   You are changing pads every 15 to 30 minutes.   You have abdominal pain.  You have a fever.   You become sweaty or weak.   You are passing large blood clots from the vagina.   You start to feel nauseous and vomit. MAKE SURE YOU:   Understand these instructions.  Will watch your condition.  Will get help right away if you are not doing well or get worse.   This information is not intended to replace advice given to you by your health care provider. Make sure you discuss any questions you  have with your health care provider.   Document Released: 09/17/2005 Document Revised: 09/22/2013 Document Reviewed: 04/16/2013 Elsevier Interactive Patient Education [employer]

## 2016-04-20 NOTE — ED Notes (Signed)
Per pt, states has an IUD-states bleeding continues-states was given oral BP pills to help decrease bleeding-states she got sick this am-while vomiting she states she "blacked out"

## 2016-08-17 ENCOUNTER — Encounter (HOSPITAL_COMMUNITY): Payer: Self-pay | Admitting: Emergency Medicine

## 2016-08-17 ENCOUNTER — Emergency Department (HOSPITAL_COMMUNITY)
Admission: EM | Admit: 2016-08-17 | Discharge: 2016-08-17 | Disposition: A | Discharge status: Home / Self Care | Payer: BLUE CROSS/BLUE SHIELD | Attending: Emergency Medicine | Admitting: Emergency Medicine

## 2016-08-17 DIAGNOSIS — R1084 Generalized abdominal pain: Secondary | ICD-10-CM | POA: Insufficient documentation

## 2016-08-17 DIAGNOSIS — R197 Diarrhea, unspecified: Secondary | ICD-10-CM | POA: Insufficient documentation

## 2016-08-17 DIAGNOSIS — R112 Nausea with vomiting, unspecified: Secondary | ICD-10-CM

## 2016-08-17 DIAGNOSIS — R1110 Vomiting, unspecified: Secondary | ICD-10-CM | POA: Diagnosis present

## 2016-08-17 LAB — CBC
HCT: 41.2 % (ref 36.0–46.0)
Hemoglobin: 14.6 g/dL (ref 12.0–15.0)
MCH: 29.1 pg (ref 26.0–34.0)
MCHC: 35.4 g/dL (ref 30.0–36.0)
MCV: 82.2 fL (ref 78.0–100.0)
Platelets: 114 10*3/uL — ABNORMAL LOW (ref 150–400)
RBC: 5.01 MIL/uL (ref 3.87–5.11)
RDW: 12.5 % (ref 11.5–15.5)
WBC: 10.5 10*3/uL (ref 4.0–10.5)

## 2016-08-17 LAB — COMPREHENSIVE METABOLIC PANEL
ALT: 14 U/L (ref 14–54)
AST: 18 U/L (ref 15–41)
Albumin: 4.3 g/dL (ref 3.5–5.0)
Alkaline Phosphatase: 63 U/L (ref 38–126)
Anion gap: 7 (ref 5–15)
BUN: 14 mg/dL (ref 6–20)
CO2: 24 mmol/L (ref 22–32)
Calcium: 9.5 mg/dL (ref 8.9–10.3)
Chloride: 108 mmol/L (ref 101–111)
Creatinine, Ser: 0.83 mg/dL (ref 0.44–1.00)
GFR calc Af Amer: >60 mL/min (ref >60)
GFR calc non Af Amer: >60 mL/min (ref >60)
Glucose, Bld: 116 mg/dL — ABNORMAL HIGH (ref 65–99)
Potassium: 3.6 mmol/L (ref 3.5–5.1)
Sodium: 139 mmol/L (ref 135–145)
Total Bilirubin: 1 mg/dL (ref 0.3–1.2)
Total Protein: 7.1 g/dL (ref 6.5–8.1)

## 2016-08-17 LAB — LIPASE, BLOOD: Lipase: 21 U/L (ref 11–51)

## 2016-08-17 LAB — I-STAT BETA HCG BLOOD, ED (MC, WL, AP ONLY): I-stat hCG, quantitative: <5 m[IU]/mL (ref <5)

## 2016-08-17 MED ORDER — ONDANSETRON HCL 4 MG/2ML IJ SOLN
4.0000 mg | Freq: Once | INTRAMUSCULAR | Status: DC | PRN
  Filled 2016-08-17: qty 2

## 2016-08-17 MED ORDER — ONDANSETRON HCL 4 MG/2ML IJ SOLN
4.0000 mg | Freq: Once | INTRAMUSCULAR | Status: AC | PRN
  Administered 2016-08-17: 4 mg via INTRAVENOUS
  Filled 2016-08-17: qty 2

## 2016-08-17 MED ORDER — ONDANSETRON 4 MG PO TBDP: 4.0000 mg | ORAL_TABLET | Freq: Four times a day (QID) | ORAL | 0 refills | Status: DC | PRN

## 2016-08-17 MED ORDER — ONDANSETRON 4 MG PO TBDP: 4.0000 mg | ORAL_TABLET | Freq: Four times a day (QID) | ORAL | 0 refills | Status: AC | PRN

## 2016-08-17 NOTE — ED Provider Notes (Signed)
[facility]-EMERGENCY DEPT Provider Note   CSN: [id number] Arrival date & time: 08/17/16  0505     History   Chief Complaint Chief Complaint  Patient presents with  . Emesis    HPI Megan Larsen is a 20 y.o. female.  The history is provided by the patient.  Emesis   This is a new problem. The current episode started 6 to 12 hours ago. The problem occurs 5 to 10 times per day. The problem has not changed since onset.The emesis has an appearance of stomach contents. There has been no fever. Associated symptoms include abdominal pain (discomfort), diarrhea and URI. Pertinent negatives include no arthralgias, no chills, no cough and no fever.   Unsure whether she had any suspicious food intake. She does report eating rice at work last night. Shortly thereafter her symptoms started.   Past Medical History:  Diagnosis Date  . Cellulitis of arm, left    surgically debreeded and packed.  Marland Kitchen. GERD (gastroesophageal reflux disease)   . Hypotension   . Intestinal infection due to bacteria     Patient Active Problem List   Diagnosis Date Noted  . Cellulitis 05/19/2014  . Hypokalemia 05/19/2014    Past Surgical History:  Procedure Laterality Date  . left arm     cellulitis    OB History    Gravida Para Term Preterm AB Living   2             SAB TAB Ectopic Multiple Live Births                   Home Medications    Prior to Admission medications   Medication Sig Start Date End Date Taking? Authorizing Provider  ibuprofen (ADVIL,MOTRIN) 200 MG tablet Take 400 mg by mouth every 6 (six) hours as needed (for pain.).   Yes Historical Provider, MD  levonorgestrel (MIRENA) 20 MCG/24HR IUD 1 each by Intrauterine route once.   Yes Historical Provider, MD  ibuprofen (ADVIL,MOTRIN) 800 MG tablet Take 1 tablet (800 mg total) by mouth 3 (three) times daily. Patient not taking: Reported on 04/20/2016 11/08/15   Shawn C Joy, PA-C  ondansetron (ZOFRAN ODT) 4 MG disintegrating tablet Take 1  tablet (4 mg total) by mouth every 6 (six) hours as needed for nausea or vomiting. 08/17/16 08/20/16  Nira ConnPedro Eduardo Amel Gianino, MD  oxyCODONE-acetaminophen (PERCOCET/ROXICET) 5-325 MG tablet Take 1-2 tablets by mouth every 4 (four) hours as needed for severe pain. Patient not taking: Reported on 04/20/2016 11/08/15   Anselm PancoastShawn C Joy, PA-C    Family History Family History  Problem Relation Age of Onset  . Cancer Mother   . Diabetes Mother   . Hyperlipidemia Father     Social History Social History  Substance Use Topics  . Smoking status: Never Smoker  . Smokeless tobacco: Never Used  . Alcohol use No     Allergies   Patient has no known allergies.   Review of Systems Review of Systems  Constitutional: Negative for chills and fever.  HENT: Negative for ear pain and sore throat.   Eyes: Negative for pain and visual disturbance.  Respiratory: Negative for cough and shortness of breath.   Cardiovascular: Negative for chest pain and palpitations.  Gastrointestinal: Positive for abdominal pain (discomfort), diarrhea and vomiting.  Genitourinary: Negative for dysuria and hematuria.  Musculoskeletal: Negative for arthralgias and back pain.  Skin: Negative for color change and rash.  Neurological: Negative for seizures and syncope.  All other systems reviewed  and are negative.    Physical Exam Updated Vital Signs BP 112/71 (BP Location: Right Arm)   Pulse 101   Temp 97.8 F (36.6 C) (Oral)   Resp 18   Ht 5\' 7"  (1.702 m)   Wt 125 lb (56.7 kg)   LMP 07/29/2016 (Approximate)   SpO2 99%   BMI 19.58 kg/m   Physical Exam  Constitutional: She is oriented to person, place, and time. She appears well-developed and well-nourished. No distress.  HENT:  Head: Normocephalic and atraumatic.  Nose: Nose normal.  Eyes: Conjunctivae and EOM are normal. Pupils are equal, round, and reactive to light. Right eye exhibits no discharge. Left eye exhibits no discharge. No scleral icterus.  Neck:  Normal range of motion. Neck supple.  Cardiovascular: Normal rate and regular rhythm.  Exam reveals no gallop and no friction rub.   No murmur heard. Pulmonary/Chest: Effort normal and breath sounds normal. No stridor. No respiratory distress. She has no rales.  Abdominal: Soft. She exhibits no distension. There is generalized tenderness (discomfort). There is no rigidity, no rebound, no guarding and no CVA tenderness.  Musculoskeletal: She exhibits no edema or tenderness.  Neurological: She is alert and oriented to person, place, and time.  Skin: Skin is warm and dry. No rash noted. She is not diaphoretic. No erythema.  Psychiatric: She has a normal mood and affect.  Vitals reviewed.    ED Treatments / Results  Labs (all labs ordered are listed, but only abnormal results are displayed) Labs Reviewed  COMPREHENSIVE METABOLIC PANEL - Abnormal; Notable for the following:       Result Value   Glucose, Bld 116 (*)    All other components within normal limits  CBC - Abnormal; Notable for the following:    Platelets 114 (*)    All other components within normal limits  LIPASE, BLOOD  I-STAT BETA HCG BLOOD, ED ([facility], [facility], [facility] ONLY)    EKG  EKG Interpretation None       Radiology No results found.  Procedures Procedures (including critical care time)  Medications Ordered in ED Medications  ondansetron (ZOFRAN) injection 4 mg (not administered)  ondansetron (ZOFRAN) injection 4 mg (4 mg Intravenous Given 08/17/16 [id number])     Initial Impression / Assessment and Plan / ED Course  I have reviewed the triage vital signs and the nursing notes.  Pertinent labs & imaging results that were available during my care of the patient were reviewed by me and considered in my medical decision making (see chart for details).  Clinical Course     Abdominal exam without evidence of peritonitis. Labs grossly reassuring without evidence of dehydration, renal insufficiency, pancreatitis. Given  therapeutic for treatment with antiemetics and IV fluids. Significant improvement in patient's symptoms. Able to tolerate by mouth. Likely food poisoning versus viral process.  Safe for discharge with strict return precautions.  Final Clinical Impressions(s) / ED Diagnoses   Final diagnoses:  Nausea vomiting and diarrhea   Disposition: Discharge  Condition: Good  I have discussed the results, Dx and Tx plan with the patient who expressed understanding and agree(s) with the plan. Discharge instructions discussed at great length. The patient was given strict return precautions who verbalized understanding of the instructions. No further questions at time of discharge.    New Prescriptions   ONDANSETRON (ZOFRAN ODT) 4 MG DISINTEGRATING TABLET    Take 1 tablet (4 mg total) by mouth every 6 (six) hours as needed for nausea or vomiting.  Follow Up: [facility] [address] [place] [address] [phone] Call  For help establishing care with a care provider      Nira Conn, MD 08/17/16 [phone]

## 2016-08-17 NOTE — ED Triage Notes (Signed)
Pt states her stomach did not feel right last night and she woke up at 3am with vomiting and has not been able to stop since  Pt states she has had loose stools but not diarrhea  Pt denies abd pain just states it feels sore from all the vomiting

## 2016-08-17 NOTE — ED Notes (Signed)
Pt states that she has been having nausea and vomiting since 300 am this morning  that was ongoing and abd pain that began 2-3 days ago. Pt reports yellow/ngreen emesis. Pt does  Report having loose bowels x 1 day.

## 2023-05-06 ENCOUNTER — Other Ambulatory Visit (HOSPITAL_BASED_OUTPATIENT_CLINIC_OR_DEPARTMENT_OTHER): Payer: Self-pay
